# Patient Record
Sex: Female | Born: 1980 | Race: White | Hispanic: No | Marital: Married | Smoking: Never smoker
Health system: Southern US, Community
[De-identification: ages and names within clinical notes are randomized; demographics above are authoritative.]

## PROBLEM LIST (undated history)

## (undated) VITALS — BP 121/83 | HR 81 | Temp 97.7°F | Resp 20 | Ht 66.0 in | Wt 218.0 lb

## (undated) DIAGNOSIS — Z789 Other specified health status: Secondary | ICD-10-CM

---
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
## 2002-03-15 ENCOUNTER — Other Ambulatory Visit: Admission: RE | Admit: 2002-03-15 | Discharge: 2002-03-15 | Payer: Self-pay | Admitting: Obstetrics and Gynecology

## 2002-03-18 ENCOUNTER — Encounter (INDEPENDENT_AMBULATORY_CARE_PROVIDER_SITE_OTHER): Payer: Self-pay | Admitting: *Deleted

## 2002-03-18 ENCOUNTER — Ambulatory Visit (HOSPITAL_COMMUNITY): Admission: RE | Admit: 2002-03-18 | Discharge: 2002-03-18 | Payer: Self-pay | Admitting: Obstetrics and Gynecology

## 2003-07-28 ENCOUNTER — Other Ambulatory Visit: Admission: RE | Admit: 2003-07-28 | Discharge: 2003-07-28 | Payer: Self-pay | Admitting: Obstetrics and Gynecology

## 2004-07-05 ENCOUNTER — Emergency Department (HOSPITAL_COMMUNITY): Admission: EM | Admit: 2004-07-05 | Discharge: 2004-07-05 | Payer: Self-pay | Admitting: Emergency Medicine

## 2006-06-12 ENCOUNTER — Emergency Department (HOSPITAL_COMMUNITY): Admission: EM | Admit: 2006-06-12 | Discharge: 2006-06-12 | Payer: Self-pay | Admitting: Emergency Medicine

## 2007-12-31 ENCOUNTER — Inpatient Hospital Stay (HOSPITAL_COMMUNITY): Admission: AD | Admit: 2007-12-31 | Discharge: 2007-12-31 | Payer: Self-pay | Admitting: Obstetrics and Gynecology

## 2008-03-16 ENCOUNTER — Inpatient Hospital Stay (HOSPITAL_COMMUNITY): Admission: AD | Admit: 2008-03-16 | Discharge: 2008-03-19 | Payer: Self-pay | Admitting: Obstetrics and Gynecology

## 2008-12-29 IMAGING — US US RENAL
1 series · 14 of 25 positions shown · non-contrast
Comparison: None

CLINICAL DATA: Severe back pain.  27 weeks pregnant.

RENAL/URINARY TRACT ULTRASOUND
TECHNIQUE: Complete ultrasound examination of the urinary tract
was performed including evaluation of the kidneys, renal collecting
systems, and urinary bladder.

[Series 1: us renal · 0.28mm/px · 14 of 42 slices shown]
[im 1/42]
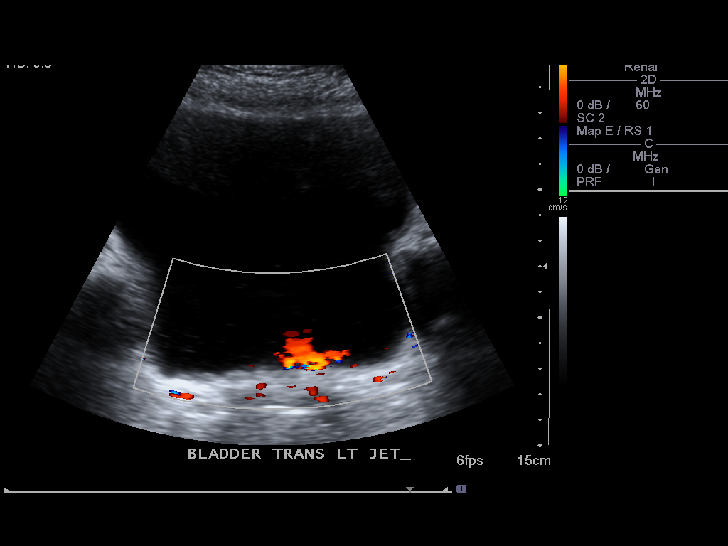
[im 4/42]
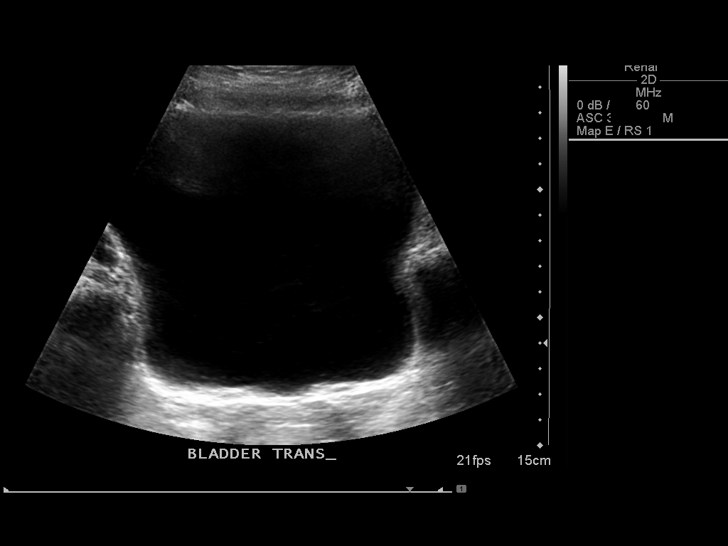
[im 7/42]
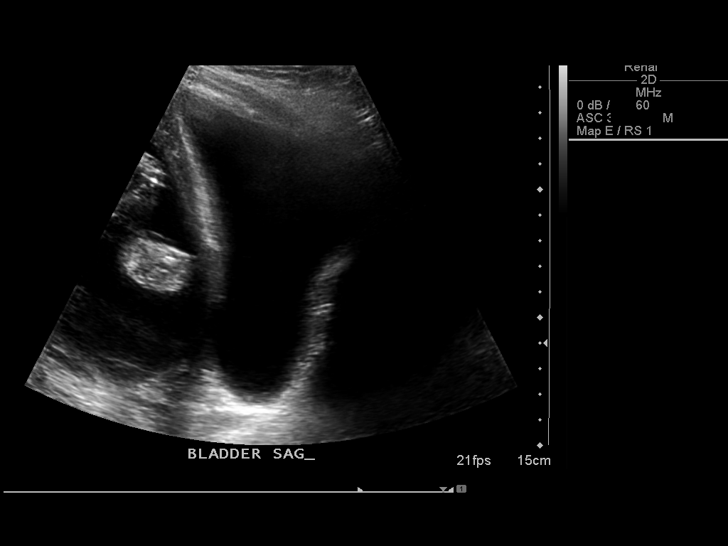
[im 11/42]
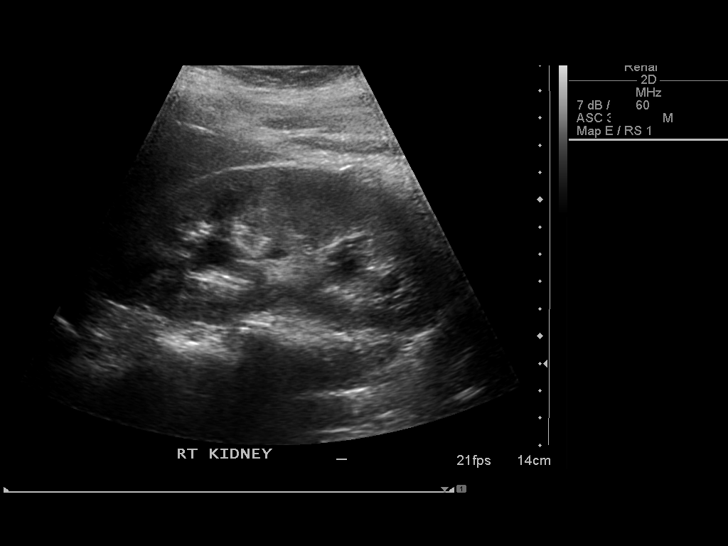
[im 14/42]
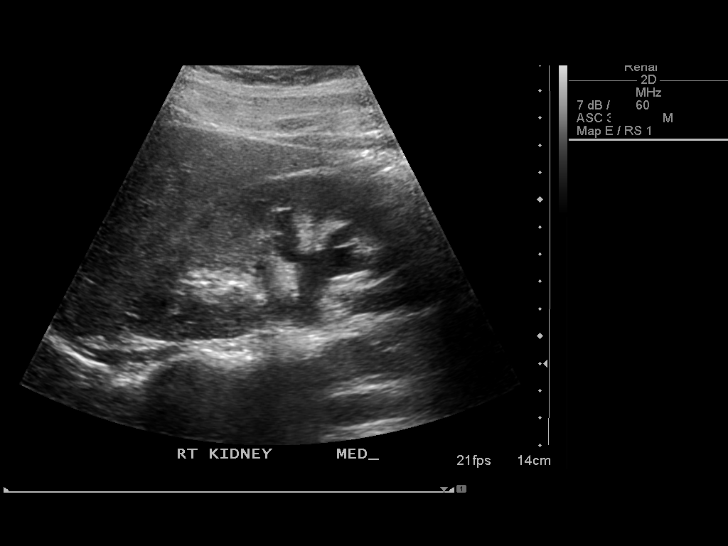
[im 16/42]
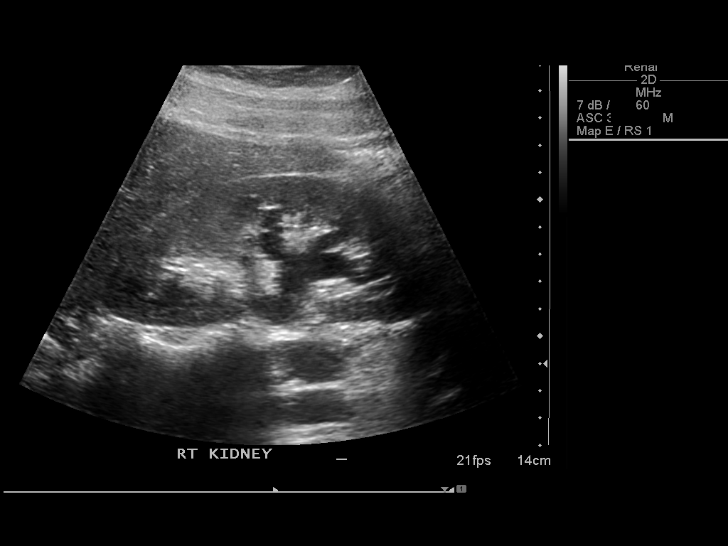
[im 19/42]
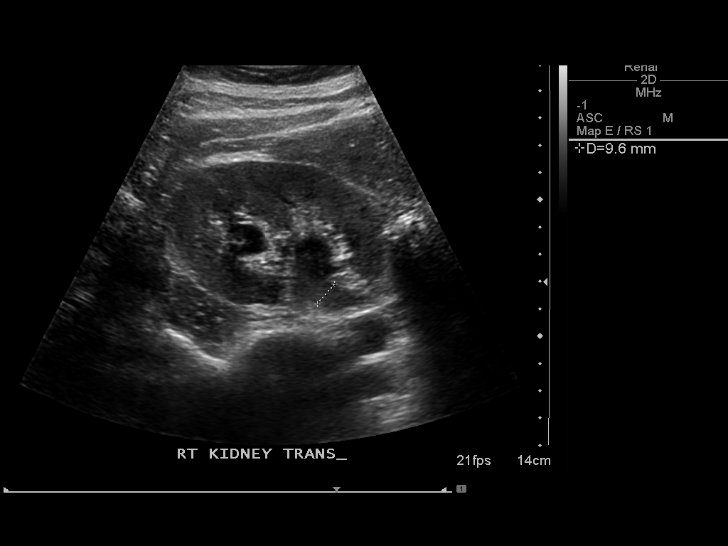
[im 23/42]
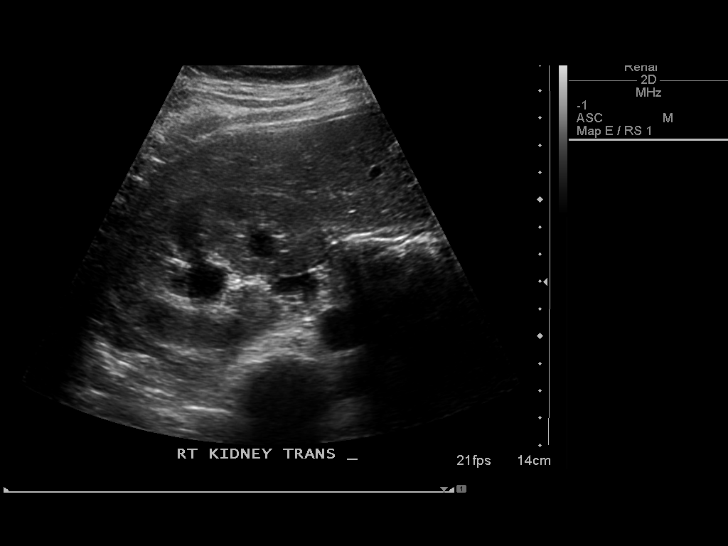
[im 26/42]
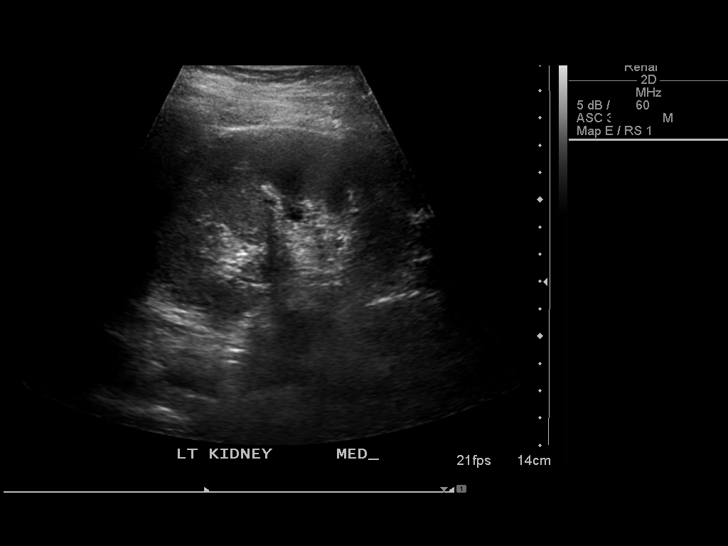
[im 28/42]
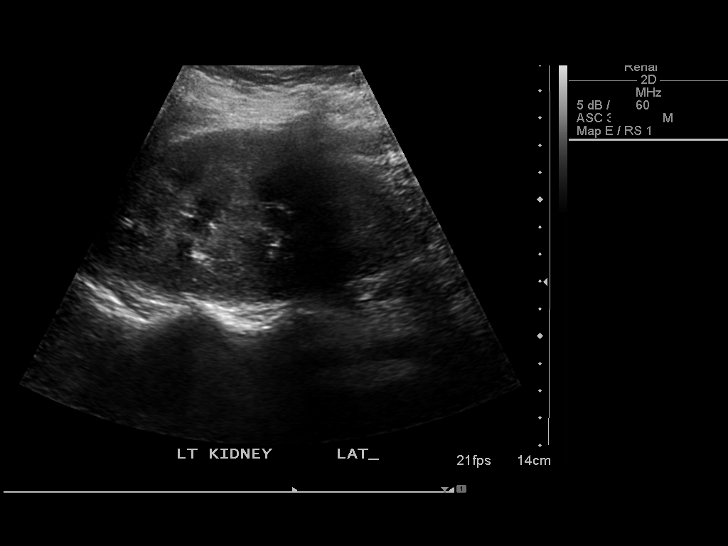
[im 31/42]
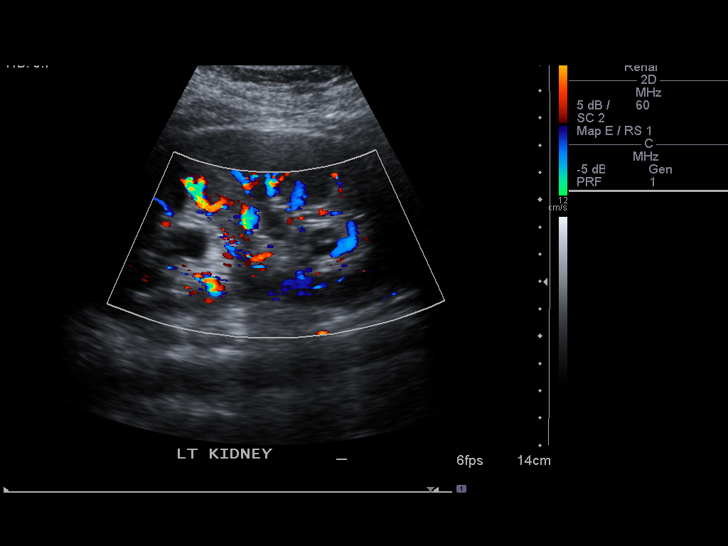
[im 35/42]
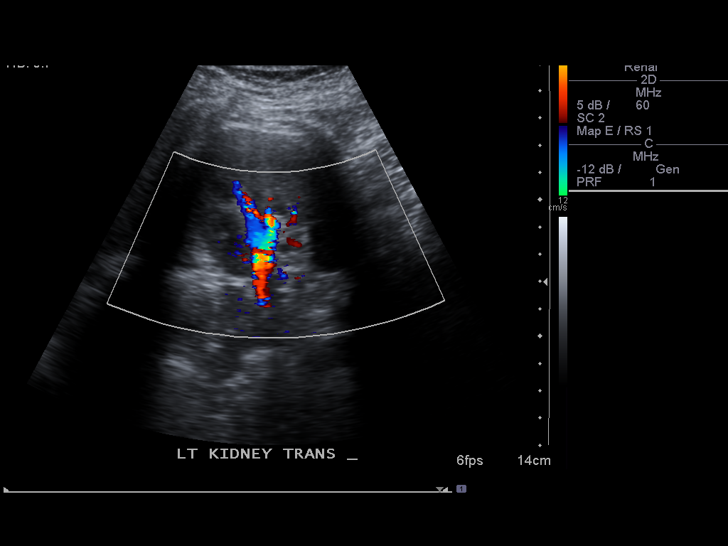
[im 38/42]
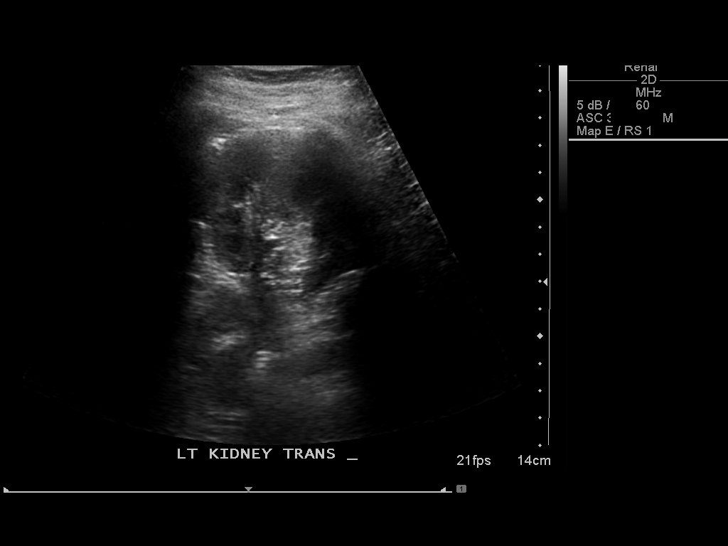
[im 42/42]
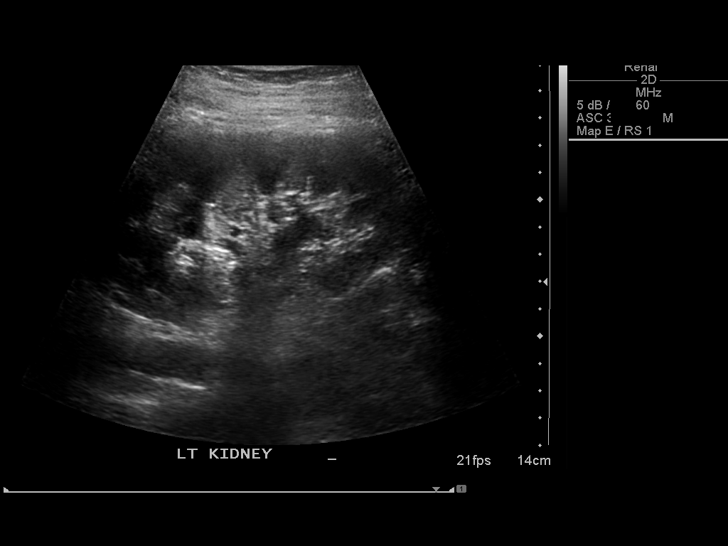

[14 of 25 positions shown; findings below may reference images not displayed]

FINDINGS: Both kidneys are normal in size, with the right kidney
measuring 12.4 cm in the left kidney and 12.3 cm length.  Both
kidneys are normal in parenchymal echogenicity.  No renal masses
are identified.

Mild renal pelvicaliectasis is seen bilaterally, right side
slightly greater than left.  The entire course of the ureters
cannot be visualized by sonography.  Images of the urinary bladder
are unremarkable appearance, and bilateral ureteral jets are
visualized on color Doppler ultrasound.
IMPRESSION: 1.  Mild bilateral renal pelvicaliectasis, right side slightly
greater than left.
2.  No evidence of renal mass or abscess.

## 2010-06-28 LAB — CBC
HCT: 25.4 % — ABNORMAL LOW (ref 36.0–46.0)
HCT: 28.9 % — ABNORMAL LOW (ref 36.0–46.0)
Hemoglobin: 8.7 g/dL — ABNORMAL LOW (ref 12.0–15.0)
Hemoglobin: 9.7 g/dL — ABNORMAL LOW (ref 12.0–15.0)
MCHC: 33.6 g/dL (ref 30.0–36.0)

## 2010-07-27 NOTE — H&P (Signed)
NAMEMATTALYN, ANDEREGG             ACCOUNT NO.:  [id number]   MEDICAL RECORD NO.:  [id number]          PATIENT TYPE:  INP   LOCATION:  9175                          FACILITY:  [facility]   PHYSICIAN:  Lenoard Aden, M.D.DATE OF BIRTH:  1980-12-04   DATE OF ADMISSION:  03/16/2008  DATE OF DISCHARGE:                              HISTORY & PHYSICAL   INDICATIONS FOR ADMISSION:  Prodromal labor with fetal tachycardia.   She is a 30 year old white female G1, P0 at 22 weeks' gestation who  presents with increased frequency of contractions.   She states she has known drug allergies.   MEDICATIONS:  Prenatal vitamins.   She is a nonsmoker, nondrinker.  She denies domestic or physical  violence.   She has a family history of bladder cancer, migraine headache.  She has  1 previous spontaneous-induced abortion.   She has a pregnancy course that is complicated by size and date  discrepancy and UTI, treated with antibiotics.   PHYSICAL EXAMINATION:  GENERAL:  She is an uncomfortable-appearing white  female in no acute distress.  HEENT:  Normal.  LUNGS:  Clear.  HEART:  Regular rhythm.  ABDOMEN:  Soft, gravid, nontender.  Cervix 1, 70%, vertex -1.  EXTREMITIES:  No cords.  NEUROLOGIC:  Nonfocal.  SKIN:  Intact.   NST reveals a reactive fetal heart tracing in the 160-170 beat per  minute range.  No decelerations noted.  Contractions every 3-5 minutes.   IMPRESSION:  Prodromal labor pattern with fetal tachycardia.   PLAN:  Extend monitoring.  We will treat as needed, admit as needed.  Anticipate attempts at vaginal delivery if admitted, otherwise we will  give Stadol for pain and monitor fetal heart rate closely.      Lenoard Aden, M.D.  Electronically Signed    RJT/MEDQ  D:  03/17/2008  T:  03/17/2008  Job:  [id number]

## 2010-07-30 NOTE — Op Note (Signed)
   NAMEESMAE, Margaret Gutierrez                           ACCOUNT NO.:  [id number]   MEDICAL RECORD NO.:  [id number]                   PATIENT TYPE:  AMB   LOCATION:  SDC                                  [facility]:  [facility]   PHYSICIAN:  Lenoard Aden, M.D.             DATE OF BIRTH:  [birth year]   DATE OF PROCEDURE:  03/18/2002  DATE OF DISCHARGE:                                 OPERATIVE REPORT   PREOPERATIVE DIAGNOSIS:  Nine week intrauterine pregnancy for elective  abortion.   POSTOPERATIVE DIAGNOSIS:  Nine week intrauterine pregnancy for elective  abortion.   PROCEDURE:  Suction dilation and evacuation.   SURGEON:  Lenoard Aden, M.D.   ANESTHESIA:  MAC paracervical by Raul Del, M.D.   ESTIMATED BLOOD LOSS:  Less than 50 cc.   COMPLICATIONS:  None.   SPECIMENS:  Products of conception to pathology.   DISPOSITION:  Patient to recovery in good condition.   DESCRIPTION OF PROCEDURE:  After being apprised of the risks of anesthesia,  infection, bleeding and uterine perforation with injury to abdominal organs  and need for repair, the patient was brought to the operating room where she  was administered IV anesthesia with controlled sedation without difficulty.  Examination under anesthesia reveals a 10 week anteflexed midposition  uterus.  No adnexal masses.  The patient was prepped and draped in the usual  sterile fashion and catheterized until the bladder was empty.  The uterus  sounds to approximately 10 cm after paracervical block with 20 cc of dilute  Xylocaine solution placed.  A single-tooth tenaculum was used to grasp the  anterior lip of the cervix which was then dilated up to a #27 Pratt dilator.  A 9 mm suction curet was placed and suction initiated.  Products of  conception were noted.  Blunt curettage in a four quadrant method reveals  the cavity to be empty.  Repeat suction and curettage confirms.  The  procedure is terminated.  Good hemostasis is intoed.   The patient tolerated  the procedure well and is transferred to the recovery room in good  condition.                                               Lenoard Aden, M.D.    RJT/MEDQ  D:  03/18/2002  T:  03/18/2002  Job:  [id number]

## 2010-12-13 LAB — URINE MICROSCOPIC-ADD ON

## 2010-12-13 LAB — URINALYSIS, ROUTINE W REFLEX MICROSCOPIC
Bilirubin Urine: NEGATIVE
Nitrite: NEGATIVE

## 2010-12-13 LAB — URINE CULTURE

## 2012-01-17 ENCOUNTER — Ambulatory Visit (INDEPENDENT_AMBULATORY_CARE_PROVIDER_SITE_OTHER): Payer: BC Managed Care – PPO | Admitting: Licensed Clinical Social Worker

## 2012-01-17 DIAGNOSIS — F39 Unspecified mood [affective] disorder: Secondary | ICD-10-CM

## 2012-01-31 ENCOUNTER — Ambulatory Visit (INDEPENDENT_AMBULATORY_CARE_PROVIDER_SITE_OTHER): Payer: BC Managed Care – PPO | Admitting: Licensed Clinical Social Worker

## 2012-01-31 DIAGNOSIS — F39 Unspecified mood [affective] disorder: Secondary | ICD-10-CM

## 2012-02-14 ENCOUNTER — Ambulatory Visit (INDEPENDENT_AMBULATORY_CARE_PROVIDER_SITE_OTHER): Payer: BC Managed Care – PPO | Admitting: Licensed Clinical Social Worker

## 2012-02-14 DIAGNOSIS — F39 Unspecified mood [affective] disorder: Secondary | ICD-10-CM

## 2012-02-28 ENCOUNTER — Ambulatory Visit (INDEPENDENT_AMBULATORY_CARE_PROVIDER_SITE_OTHER): Payer: BC Managed Care – PPO | Admitting: Licensed Clinical Social Worker

## 2012-02-28 DIAGNOSIS — F39 Unspecified mood [affective] disorder: Secondary | ICD-10-CM

## 2012-03-20 ENCOUNTER — Ambulatory Visit (INDEPENDENT_AMBULATORY_CARE_PROVIDER_SITE_OTHER): Payer: BC Managed Care – PPO | Admitting: Licensed Clinical Social Worker

## 2012-03-20 DIAGNOSIS — F39 Unspecified mood [affective] disorder: Secondary | ICD-10-CM

## 2012-04-03 ENCOUNTER — Ambulatory Visit (INDEPENDENT_AMBULATORY_CARE_PROVIDER_SITE_OTHER): Payer: BC Managed Care – PPO | Admitting: Licensed Clinical Social Worker

## 2012-04-03 DIAGNOSIS — F39 Unspecified mood [affective] disorder: Secondary | ICD-10-CM

## 2012-04-10 ENCOUNTER — Ambulatory Visit (INDEPENDENT_AMBULATORY_CARE_PROVIDER_SITE_OTHER): Payer: BC Managed Care – PPO | Admitting: Licensed Clinical Social Worker

## 2012-04-10 DIAGNOSIS — F39 Unspecified mood [affective] disorder: Secondary | ICD-10-CM

## 2012-04-24 ENCOUNTER — Ambulatory Visit (INDEPENDENT_AMBULATORY_CARE_PROVIDER_SITE_OTHER): Payer: BC Managed Care – PPO | Admitting: Licensed Clinical Social Worker

## 2012-04-24 DIAGNOSIS — F39 Unspecified mood [affective] disorder: Secondary | ICD-10-CM

## 2012-05-08 ENCOUNTER — Ambulatory Visit (INDEPENDENT_AMBULATORY_CARE_PROVIDER_SITE_OTHER): Payer: BC Managed Care – PPO | Admitting: Licensed Clinical Social Worker

## 2012-05-08 DIAGNOSIS — F39 Unspecified mood [affective] disorder: Secondary | ICD-10-CM

## 2012-05-22 ENCOUNTER — Ambulatory Visit (INDEPENDENT_AMBULATORY_CARE_PROVIDER_SITE_OTHER): Payer: BC Managed Care – PPO | Admitting: Licensed Clinical Social Worker

## 2012-05-22 DIAGNOSIS — F39 Unspecified mood [affective] disorder: Secondary | ICD-10-CM

## 2012-06-05 ENCOUNTER — Ambulatory Visit (INDEPENDENT_AMBULATORY_CARE_PROVIDER_SITE_OTHER): Payer: BC Managed Care – PPO | Admitting: Licensed Clinical Social Worker

## 2012-06-05 DIAGNOSIS — F39 Unspecified mood [affective] disorder: Secondary | ICD-10-CM

## 2012-06-19 ENCOUNTER — Ambulatory Visit: Payer: BC Managed Care – PPO | Admitting: Licensed Clinical Social Worker

## 2012-07-03 ENCOUNTER — Ambulatory Visit (INDEPENDENT_AMBULATORY_CARE_PROVIDER_SITE_OTHER): Payer: BC Managed Care – PPO | Admitting: Licensed Clinical Social Worker

## 2012-07-03 DIAGNOSIS — F39 Unspecified mood [affective] disorder: Secondary | ICD-10-CM

## 2012-07-24 ENCOUNTER — Ambulatory Visit: Payer: BC Managed Care – PPO | Admitting: Licensed Clinical Social Worker

## 2012-12-20 ENCOUNTER — Encounter (HOSPITAL_COMMUNITY): Payer: Self-pay

## 2012-12-20 ENCOUNTER — Ambulatory Visit (INDEPENDENT_AMBULATORY_CARE_PROVIDER_SITE_OTHER): Payer: BC Managed Care – PPO | Admitting: Licensed Clinical Social Worker

## 2012-12-20 ENCOUNTER — Inpatient Hospital Stay (HOSPITAL_COMMUNITY)
Admission: RE | Admit: 2012-12-20 | Discharge: 2012-12-25 | DRG: 885 | Disposition: A | Discharge status: Home / Self Care | Payer: No Typology Code available for payment source | Attending: Psychiatry | Admitting: Psychiatry

## 2012-12-20 DIAGNOSIS — R45851 Suicidal ideations: Secondary | ICD-10-CM

## 2012-12-20 DIAGNOSIS — F322 Major depressive disorder, single episode, severe without psychotic features: Secondary | ICD-10-CM

## 2012-12-20 DIAGNOSIS — F329 Major depressive disorder, single episode, unspecified: Principal | ICD-10-CM | POA: Diagnosis present

## 2012-12-20 DIAGNOSIS — F411 Generalized anxiety disorder: Secondary | ICD-10-CM | POA: Diagnosis present

## 2012-12-20 DIAGNOSIS — F39 Unspecified mood [affective] disorder: Secondary | ICD-10-CM

## 2012-12-20 MED ORDER — MAGNESIUM HYDROXIDE 400 MG/5ML PO SUSP: 30.0000 mL | Freq: Every day | ORAL | Status: DC | PRN

## 2012-12-20 MED ORDER — ACETAMINOPHEN 325 MG PO TABS: 650.0000 mg | ORAL_TABLET | Freq: Four times a day (QID) | ORAL | Status: DC | PRN

## 2012-12-20 MED ORDER — INFLUENZA VAC SPLIT QUAD 0.5 ML IM SUSP
0.5000 mL | INTRAMUSCULAR | Status: AC
  Filled 2012-12-20: qty 0.5

## 2012-12-20 MED ORDER — ALUM & MAG HYDROXIDE-SIMETH 200-200-20 MG/5ML PO SUSP: 30.0000 mL | ORAL | Status: DC | PRN

## 2012-12-20 MED ORDER — HYDROXYZINE HCL 25 MG PO TABS
25.0000 mg | ORAL_TABLET | Freq: Every evening | ORAL | Status: DC | PRN
  Administered 2012-12-25: 25 mg via ORAL
  Filled 2012-12-20: qty 1

## 2012-12-20 NOTE — BH Assessment (Signed)
Assessment Note  Margaret Gutierrez is an 32 y.o. female who presents upon the recommendation of her therapist Berniece Andreas.  This Clinical research associate spoke to [place] with written consent from Margaret Hooley.  [name] reports that she has not seen Margaret Gutierrez since May of this year, but that she had been doing therapy with her since last August as she went through her divorce, which was finalized in September of 2014.  Yesterday she received a call from [place] requesting an appointment and during their session today, Special disclosed that she had withheld some information from her boss, who is like a mother to her, and her boss found out and confronted her about it, stating that she had seriously damaged the relationship.  She states Margaret Gutierrez said that she was feeling suicidal and offhandedly stated she might asphyxiate herself.  [name] was concerned because of Margaret Gutierrez's limited support system.  Before this writer was able to begin her assessment, [name] began requesting to leave.  During the assessment, she stated, "I overreacted, which I always do.  I said I couldn't imagine living without her in my life.  As I've been sitting here I've been thinking about my little girl.  That's my whole life and she needs me.  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 Dr Joelene Millin recently found out that Margaret Gutierrez has not been in therapy for the last several months even though Margaret Gutierrez has led her to believe this was the case.  She also discovered that Margaret Gutierrez has maintained a relationship with her ex-husband.  Demoni states that she had some suspicion that Dr Joelene Millin might know, and she made the appointment with her therapist to talk about her deceipt with a plan to disclose it to her, but her boss took her to lunch today and confronted her before she had the opportunity.  She said that her boss is the type of person who takes lying very seriously and that her lie made her feel like she couldn't trust her anymore  and that they needed to pull back from their relationship.  [name] really values [name] position in her life and was very hurt by it's ending.  She is unsure about how to repair it or if that is possible.  Upon reflection about the situation, she recalled that Dr Joelene Millin said, "She loves me, but has to limit time with me and doesn't want to give up on me."  Her concern about losing this relationship and feelings of guilt overcame her and made her think of suicide.  But she later said, "nothing is worth taking your own life especially a friendship.  I'd rather be alive and still have my daughter."  Margaret Gutierrez has a 55.52 year old daughter, Margaret Gutierrez, that she co-parents with her husband.  Despite their recent divorce, Mishaal reports their relationship is better than ever.    She signed a release to obtain collateral information from her ex husband Margaret Gutierrez at the request of Dr Geryl Rankins.  [name] stated that he and Margaret Gutierrez talk 3 times a day and see one another when they're not working.  He states that he is a support to her, but that other than Dr Joelene Millin, Margaret Gutierrez doesn't have much of a support system.  He reports that he spoke to her earlier today after she had been confronted by Dr Joelene Millin and she stated that she "either needed to go away or she was going to do something."  He reminded her about himself and their daughter and said he would always be there for her.  He said, "she sometimes flies off the handle about things and it's about calming her down more than anything."  He reports that they've been together since highschool-13 years-and she has no history of suicidal thoughts or attempts.  He also reports taht "she sometimes rushes into things and doesn't think them through."    Berniece Andreas corroborates that the patient is sometimes impulsive.  This Clinical research associate consulted with Dr Geryl Rankins at [facility] who was concerned about her initial plan reported to her therapist and then denial at [facility].  He recognized her  impulsivity and was concerned that she may act on it as the dispute continues to play out or she recognizes further consequences of it.  He was not comfortable with her minimal supports and the fact that she lived alone.  He believes that she may need medication evaluation and further crisis stabilization and recommends inpatient treatment and involuntary commitment if the patient is unwilling to sign herself in voluntarily.  Margaret Gutierrez was tearful when notified, stating she just wanted to go home to her daughter, but was willing to sign in voluntariily and hopeful she would be discharged tomorrow.  Berniece Andreas notified of admission.    Axis I: Major Depression, Recurrent severe Axis II: Deferred Axis III: No past medical history on file. Axis IV: problems with primary support group Axis V: 41-50 serious symptoms  Past Medical History: No past medical history on file.  No past surgical history on file.  Family History: No family history on file.  Social History:  has no tobacco, alcohol, and drug history on file.  Additional Social History:  Alcohol / Drug Use History of alcohol / drug use?: No history of alcohol / drug abuse  CIWA:   COWS:    Allergies: Allergies not on file  Home Medications:  No prescriptions prior to admission    OB/GYN Status:  No LMP recorded.  General Assessment Data Location of Assessment: [facility] Is this a Tele or Face-to-Face Assessment?: Face-to-Face Is this an Initial Assessment or a Re-assessment for this encounter?: Initial Assessment Living Arrangements: Children Can pt return to current living arrangement?: Yes Admission Status: Voluntary Transfer from: Acute Hospital Referral Source: Self/Family/Friend     [facility] Crisis Care Plan Living Arrangements: Children Name of Therapist: Berniece Andreas  Education Status Is patient currently in school?: No  Risk to self Suicidal Ideation: Yes-Currently Present Suicidal Intent: No Is  patient at risk for suicide?: Yes Suicidal Plan?: No-Not Currently/Within Last 6 Months (reportedly asphixiation) Access to Means: No What has been your use of drugs/alcohol within the last 12 months?: denies Previous Attempts/Gestures: No How many times?: 0 Other Self Harm Risks: denies Intentional Self Injurious Behavior: None Family Suicide History: No Recent stressful life event(s): Turmoil (Comment) (damaged relationship with a friend) Persecutory voices/beliefs?: No Depression: Yes Depression Symptoms: Despondent;Tearfulness;Guilt Substance abuse history and/or treatment for substance abuse?: No Suicide prevention information given to non-admitted patients: Yes  Risk to Others Homicidal Ideation: No Thoughts of Harm to Others: No Current Homicidal Intent: No Current Homicidal Plan: No Access to Homicidal Means: No History of harm to others?: No Assessment of Violence: On admission Does patient have access to weapons?: No Criminal Charges Pending?: No Does patient have a court date: No  Psychosis Hallucinations: None noted Delusions: None noted  Mental Status Report Appear/Hygiene: Other (Comment) (unremarkable) Eye Contact: Good Motor Activity: Freedom of  movement Speech: Logical/coherent Level of Consciousness: Alert Mood: Depressed Affect: Appropriate to circumstance Anxiety Level: None Thought Processes: Relevant;Coherent Judgement: Impaired Orientation: Person;Place;Time;Situation Obsessive Compulsive Thoughts/Behaviors: None  Cognitive Functioning Concentration: Normal Memory: Recent Intact;Remote Intact IQ: Average Insight: Fair Impulse Control: Fair Appetite: Good Weight Loss:  (unk) Weight Gain: 0 Sleep: Decreased Total Hours of Sleep: 4 Vegetative Symptoms: None  ADLScreening [facility] Assessment Services) Patient's cognitive ability adequate to safely complete daily activities?: Yes Patient able to express need for assistance with ADLs?:  Yes Independently performs ADLs?: Yes (appropriate for developmental age)  Prior Inpatient Therapy Prior Inpatient Therapy: No  Prior Outpatient Therapy Prior Outpatient Therapy: Yes Prior Therapy Dates: August 2013-may 2014 and Recently Prior Therapy Facilty/Provider(s): [name] [place] Reason for Treatment: Divorce  ADL Screening (condition at time of admission) Patient's cognitive ability adequate to safely complete daily activities?: Yes Patient able to express need for assistance with ADLs?: Yes Independently performs ADLs?: Yes (appropriate for developmental age)       Abuse/Neglect Assessment (Assessment to be complete while patient is alone) Physical Abuse: Denies Verbal Abuse: Denies Sexual Abuse: Denies Exploitation of patient/patient's resources: Denies     Merchant navy officer (For Healthcare) Advance Directive: Patient does not have advance directive;Patient would not like information Pre-existing out of facility DNR order (yellow form or pink MOST form): No Nutrition Screen- [facility] Adult/WL/AP Patient's home diet: Regular  Additional Information 1:1 In Past 12 Months?: No CIRT Risk: No Elopement Risk: No Does patient have medical clearance?: Yes     Disposition:  Disposition Initial Assessment Completed for this Encounter: Yes Disposition of Patient: Inpatient treatment program Type of inpatient treatment program: Adult  On Site Evaluation by:   Reviewed with Physician:    Steward Ros 12/20/2012 7:38 PM

## 2012-12-20 NOTE — Progress Notes (Signed)
Patient ID: Margaret Gutierrez, female   DOB: 11/15/1980, 32 y.o.   MRN: [id number]  Admission Note:  D:32 yr female who presents [facility] in no acute distress for the treatment of SI and Depression. Pt appears flat and depressed. Pt was calm and cooperative with admission process. Pt denies SI/HI/AVH/Pain at this time. Pt states she went to her therapist and told her that she was thinking about killing herself, by Carbon Monoxide poisoning. Pt states "I'm ok now". " I was upset because I lied to a close friend and she found out, and we had a falling out  and told me how hurt she was and I felt bad"   A:Skin was assessed(by female nurse)  and found to be clear of any abnormal marks apart from tattoo L-forearm 2 tattoo on upper back. POC and unit policies explained and understanding verbalized. Consents obtained. Food and fluids offered, and  Accepted.   Pt had no additional questions or concerns.

## 2012-12-20 NOTE — Tx Team (Signed)
Initial Interdisciplinary Treatment Plan  PATIENT STRENGTHS: (choose at least two) Ability for insight General fund of knowledge Motivation for treatment/growth Physical Health  PATIENT STRESSORS: Friend problems   PROBLEM LIST: Problem List/Patient Goals Date to be addressed Date deferred Reason deferred Estimated date of resolution  Risk doe SI      Depression                                                 DISCHARGE CRITERIA:  Improved stabilization in mood, thinking, and/or behavior  PRELIMINARY DISCHARGE PLAN: Outpatient therapy  PATIENT/FAMIILY INVOLVEMENT: This treatment plan has been presented to and reviewed with the patient, Margaret Gutierrez.  The patient and family have been given the opportunity to ask questions and make suggestions.  Jacques Navy A 12/20/2012, 8:41 PM

## 2012-12-21 DIAGNOSIS — F411 Generalized anxiety disorder: Secondary | ICD-10-CM

## 2012-12-21 DIAGNOSIS — R45851 Suicidal ideations: Secondary | ICD-10-CM

## 2012-12-21 DIAGNOSIS — F322 Major depressive disorder, single episode, severe without psychotic features: Secondary | ICD-10-CM | POA: Diagnosis present

## 2012-12-21 DIAGNOSIS — F329 Major depressive disorder, single episode, unspecified: Principal | ICD-10-CM

## 2012-12-21 LAB — URINALYSIS, DIPSTICK ONLY
Glucose, UA: NEGATIVE mg/dL
Ketones, ur: NEGATIVE mg/dL
Urobilinogen, UA: 0.2 mg/dL (ref 0.0–1.0)
pH: 6 (ref 5.0–8.0)

## 2012-12-21 NOTE — Tx Team (Signed)
Interdisciplinary Treatment Plan Update   Date Reviewed:  12/21/2012  Time Reviewed:  10:32 AM  Progress in Treatment:   Attending groups: Yes Participating in groups: Yes Taking medication as prescribed: Yes  Tolerating medication: Yes Family/Significant other contact made: No, but will ask patient for consent for collateral contact Patient understands diagnosis: Yes  Discussing patient identified problems/goals with staff: Yes Medical problems stabilized or resolved: Yes Denies suicidal/homicidal ideation: Yes Patient has not harmed self or others: Yes  For review of initial/current patient goals, please see plan of care.  Estimated Length of Stay:  3-5 days  Reasons for Continued Hospitalization:  Anxiety Depression Medication stabilization   New Problems/Goals identified:    Discharge Plan or Barriers:   Home with outpatient follow up  Additional Comments:  Margaret Gutierrez is an 32 y.o. female who presents upon the recommendation of her therapist Berniece Andreas. This Clinical research associate spoke to [place] with written consent from [name]. [name] reports that she has not seen [name] since May of this year, but that she had been doing therapy with her since last August as she went through her divorce, which was finalized in September of 2014. Yesterday she received a call from [place] requesting an appointment and during their session today, Nettye disclosed that she had withheld some information from her boss, who is like a mother to her, and her boss found out and confronted her about it, stating that she had seriously damaged the relationship. She states Shakti said that she was feeling suicidal and offhandedly stated she might asphyxiate herself. [name] was concerned because of [name]'s limited support    Attendees:  Patient:  12/21/2012 10:32 AM   Signature: Mervyn Gay, MD 12/21/2012 10:32 AM  Signature:  Verne Spurr, PA 12/21/2012 10:32 AM  Signature: Harold Barban, RN 12/21/2012  10:32 AM  Signature:Beverly Terrilee Croak, RN 12/21/2012 10:32 AM  Signature:  Neill Loft RN 12/21/2012 10:32 AM  Signature:  Juline Patch, LCSW 12/21/2012 10:32 AM  Signature:  Reyes Ivan, LCSW 12/21/2012 10:32 AM  Signature:  Sharin Grave Coordinator 12/21/2012 10:32 AM  Signature:  Chinita Greenland, RN 12/21/2012 10:32 AM  Signature: Leighton Parody, RN 12/21/2012  10:32 AM  Signature:   Nestor Ramp, RN 12/21/2012  10:32 AM  Signature:  Elliot Cousin, RN 12/21/2012  10:32 AM    Scribe for Treatment Team:   Juline Patch,  12/21/2012 10:32 AM

## 2012-12-21 NOTE — [facility] Group Notes (Signed)
[facility] LCSW Aftercare Discharge Planning Group Note   12/21/2012 10:51 AM    Participation Quality:  [place]  Mood/Affect:  Appropriate  Depression Rating:  1  Anxiety Rating:  1  Thoughts of Suicide:  No  Will you contract for safety?   NA  Current AVH:  No  Plan for Discharge/Comments:  Patient attending discharge planning group and actively participated in group. Patient advised she is sees Berniece Andreas for outpatient services.  SW provided all participants with daily workbook and information on services offered by [facility].   Transportation Means: Patient has transportation.   Supports:  Patient has a support system.   Collette Pescador, Joesph July

## 2012-12-21 NOTE — Progress Notes (Signed)
D: Patient appropriate and cooperative with staff and peers. Patient's affect is sad and mood is depressed. She reported on the self inventory sheet that she slept well last night, appetite and ability to pay attention are both good and energy level is normal. Patient rated depression and feelings of hopelessness "1". She's attending groups and meals. No scheduled medications at this time.  A: Support and encouragement provided to patient. Monitor Q15 minute checks for safety.  R: Patient receptive. Denies SI/HI/AVH. Patient remains safe on the unit.
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
## 2012-12-21 NOTE — Progress Notes (Signed)
Patient has been up and active on the unit, attended group this evening and has voiced no complaints. Patient reports having had a good day and her ex-husband visited her on today. Patient reports that she misses her daughter.  Patient currently denies having pain, -si/hi/a/v hall. Support and encouragement offered, safety maintained on unit, will continue to monitor.

## 2012-12-21 NOTE — [facility] Group Notes (Signed)
[facility] LCSW Group Therapy  Feelings Around Relapse 1:15 -2:30        12/21/2012  3:41 PM   Type of Therapy:  Group Therapy  Participation Level:  Appropriate  Participation Quality:  Appropriate  Affect:  Appropriate  Cognitive:  Attentive Appropriate  Insight:  Developing/Improving  Engagement in Therapy: Developing/Improving  Modes of Intervention:  Discussion Exploration Problem-Solving Supportive  Summary of Progress/Problems:  The topic for today was feelings around relapse.    Patient processed feelings toward relapse and was able to relate to peers. She advised relapse for her would be being dishonest out of fear of not being accepted.  Patient identified coping skills that can be used to prevent a relapse.   Wynn Banker 12/21/2012 3:41 PM
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
## 2012-12-21 NOTE — [facility] Suicide Risk Assessment (Signed)
Suicide Risk Assessment  Admission Assessment     Nursing information obtained from:  Patient Demographic factors:  Living alone Current Mental Status:  Self-harm thoughts Loss Factors:  NA Historical Factors:  NA Risk Reduction Factors:  Responsible for children under 30 years of age;Living with another person, especially a relative;Employed  CLINICAL FACTORS:   Severe Anxiety and/or Agitation Depression:   Anhedonia Hopelessness Impulsivity Insomnia Recent sense of peace/wellbeing Severe Unstable or Poor Therapeutic Relationship Previous Psychiatric Diagnoses and Treatments  COGNITIVE FEATURES THAT CONTRIBUTE TO RISK:  Closed-mindedness Loss of executive function Polarized thinking Thought constriction (tunnel vision)    SUICIDE RISK:   Moderate:  Frequent suicidal ideation with limited intensity, and duration, some specificity in terms of plans, no associated intent, good self-control, limited dysphoria/symptomatology, some risk factors present, and identifiable protective factors, including available and accessible social support.  PLAN OF CARE: Admit voluntarily and emergently from [facility] assessment for depression and suicidal ideation with plan of carbon monoxide poisoning.   I certify that inpatient services furnished can reasonably be expected to improve the patient's condition.   [name]., MD 12/21/2012, 9:21 AM

## 2012-12-22 LAB — COMPREHENSIVE METABOLIC PANEL
ALT: 8 U/L (ref 0–35)
AST: 12 U/L (ref 0–37)
Albumin: 3.5 g/dL (ref 3.5–5.2)
Alkaline Phosphatase: 99 U/L (ref 39–117)
BUN: 8 mg/dL (ref 6–23)
CO2: 24 mEq/L (ref 19–32)
Calcium: 8.8 mg/dL (ref 8.4–10.5)
Chloride: 101 mEq/L (ref 96–112)
Creatinine, Ser: 0.79 mg/dL (ref 0.50–1.10)
GFR calc Af Amer: >90 mL/min (ref >90)
GFR calc non Af Amer: >90 mL/min (ref >90)
Glucose, Bld: 113 mg/dL — ABNORMAL HIGH (ref 70–99)
Potassium: 3.6 mEq/L (ref 3.5–5.1)
Sodium: 135 mEq/L (ref 135–145)
Total Bilirubin: 0.7 mg/dL (ref 0.3–1.2)
Total Protein: 7.2 g/dL (ref 6.0–8.3)

## 2012-12-22 LAB — CBC WITH DIFFERENTIAL/PLATELET
Basophils Absolute: 0 10*3/uL (ref 0.0–0.1)
Basophils Relative: 0 % (ref 0–1)
Eosinophils Absolute: 0.2 10*3/uL (ref 0.0–0.7)
Eosinophils Relative: 2 % (ref 0–5)
HCT: 39.4 % (ref 36.0–46.0)
Hemoglobin: 13.3 g/dL (ref 12.0–15.0)
Lymphocytes Relative: 17 % (ref 12–46)
Lymphs Abs: 1.2 10*3/uL (ref 0.7–4.0)
MCH: 27.8 pg (ref 26.0–34.0)
MCHC: 33.8 g/dL (ref 30.0–36.0)
MCV: 82.4 fL (ref 78.0–100.0)
Monocytes Absolute: 0.5 10*3/uL (ref 0.1–1.0)
Monocytes Relative: 7 % (ref 3–12)
Neutro Abs: 5.2 10*3/uL (ref 1.7–7.7)
Neutrophils Relative %: 74 % (ref 43–77)
Platelets: 302 10*3/uL (ref 150–400)
RBC: 4.78 MIL/uL (ref 3.87–5.11)
RDW: 13.1 % (ref 11.5–15.5)
WBC: 7.1 10*3/uL (ref 4.0–10.5)

## 2012-12-22 LAB — TSH: TSH: 6.868 u[IU]/mL — ABNORMAL HIGH (ref 0.350–4.500)

## 2012-12-22 NOTE — Progress Notes (Signed)
[facility] Group Notes:  (Nursing/MHT/Case Management/Adjunct)  Date:  12/21/2012 Time:  2000  Type of Therapy:  Psychoeducational Skills  Participation Level:  Active  Participation Quality:  Appropriate  Affect:  Appropriate  Cognitive:  Appropriate  Insight:  Good  Engagement in Group:  Engaged  Modes of Intervention:  Education  Summary of Progress/Problems: The patient described her day as having been good. She stated that she had placed a few telephone calls and that she felt more relaxed today. Her goal for tomorrow is to "work on me".   Tevin Shillingford S 12/22/2012, 12:41 AM

## 2012-12-22 NOTE — Progress Notes (Signed)
Patient ID: Margaret Gutierrez, female   DOB: September 25, 1980, 32 y.o.   MRN: [id number] D: Pt is awake and active on the unit this AM. Pt denies SI/HI and A/V hallucinations. Pt rates their depression at 1 and hopelessness at 1. Pt's mood is depressed/withdrawn and her affect is flat. Pt writes that she plans to "live an honest and truthful life and do the best I can. I'm feeling much better about things. I would like to go home and see my little girl." Pt forwards little to staff but is attending groups and participating gin the milieu.   A: Encouraged pt to discuss feelings with staff and administered medication per MD orders. Writer also encouraged pt to participate in groups.  R: Pt is attending groups and tolerating medications well. Writer will continue to monitor. 15 minute checks are ongoing for safety.

## 2012-12-22 NOTE — Progress Notes (Signed)
Psychoeducational Group Note  Date:  10/22/2011 Time: 1015  Group Topic/Focus:  Identifying Needs:   The focus of this group is to help patients identify their personal needs that have been historically problematic and identify healthy behaviors to address their needs.  Participation Level:  active Participation Quality: good Affect: flat Cognitive:    Insight:  good  Engagement in Group: engaged  Additional Comments:  PD RN bC

## 2012-12-22 NOTE — Progress Notes (Addendum)
Patient ID: Margaret Gutierrez, female   DOB: 05-12-1980, 32 y.o.   MRN: [id number]  S:  Seen today. attending groups. Better sleep and mood is improving now.  Psychiatric Specialty Exam:  Physical Exam   ROS negative    General Appearance: Guarded   Eye Contact:: Minimal   Speech: Normal Rate   Volume: Normal   Mood: Anxious, Depressed, Hopeless and Worthless   Affect: Constricted and Depressed   Thought Process: Goal Directed and Intact   Orientation: Full (Time, Place, and Person)   Thought Content: WDL   Suicidal Thoughts: denies   Homicidal Thoughts: No   Memory: Immediate; Fair   Judgement: Impaired   Insight: Lacking   Psychomotor Activity: normal  Concentration: Fair   Recall: Fair   Akathisia: NA   Handed: Right   AIMS (if indicated):   Assets: Communication Skills  Desire for Improvement  Financial Resources/Insurance  Housing  Physical Health  Resilience  Social Support  Transportation   Sleep: Number of Hours: 6.75   .   Assessment:  DSM5:  Schizophrenia Disorders:  Obsessive-Compulsive Disorders:  Trauma-Stressor Disorders:  Substance/Addictive Disorders:  Depressive Disorders:  AXIS I: Generalized Anxiety Disorder and Major Depression, single episode  AXIS II: Cluster B Traits  AXIS III: History reviewed. No pertinent past medical history.  AXIS IV: other psychosocial or environmental problems, problems related to social environment and problems with primary support group  AXIS V: 35  Treatment Plan/Recommendations:  Continue current meds

## 2012-12-22 NOTE — Progress Notes (Signed)
D: Pt has participated appropriately in all unit activities. Pt states that she is feeling better.Denies SI,HI, & AVH. Pt reports that she is sleeping well. A: Supported & encouraged. Continues on 15 minute checks. R: Pt safety maintained.

## 2012-12-22 NOTE — [facility] Group Notes (Signed)
[facility] Group Notes: (Clinical Social Work)   12/22/2012      Type of Therapy:  Group Therapy   Participation Level:  Did Not Attend    Ambrose Mantle, LCSW 12/22/2012, 4:16 PM

## 2012-12-23 LAB — RAPID URINE DRUG SCREEN, HOSP PERFORMED
Amphetamines: NOT DETECTED
Benzodiazepines: NOT DETECTED
Opiates: NOT DETECTED

## 2012-12-23 NOTE — Progress Notes (Signed)
[facility] Group Notes:  (Nursing/MHT/Case Management/Adjunct)  Date:  12/22/2012 Time:  2000  Type of Therapy:  Psychoeducational Skills  Participation Level:  Minimal  Participation Quality:  Resistant  Affect:  Appropriate  Cognitive:  Appropriate  Insight:  Lacking  Engagement in Group:  Lacking  Modes of Intervention:  Education  Summary of Progress/Problems: The patient shared with the group that she had a good day but would not offer any additional details. Her coping mechanisms include the following: exercise, and group therapy. Her goal for tomorrow is to make a phone call for which she has been preparing herself for some time.   Kaenan Jake S 12/23/2012, 2:08 AM

## 2012-12-23 NOTE — Progress Notes (Signed)
Report received from C. Microbiologist. Writer entered patients room and observed her lying in bed asleep with eyes closed and resp. even and unlabored

## 2012-12-23 NOTE — Progress Notes (Signed)
Patient ID: Margaret Gutierrez, female   DOB: [birth year], 32 y.o.   MRN: [id number]  S:  Seen today. attending groups. Better sleep and mood is fair. Not interested for any meds now.  Psychiatric Specialty Exam:  Physical Exam   ROS negative    General Appearance: Guarded   Eye Contact:: Minimal   Speech: Normal Rate   Volume: Normal   Mood: Anxious, Depressed, Hopeless and Worthless   Affect: Constricted and Depressed   Thought Process: Goal Directed and Intact   Orientation: Full (Time, Place, and Person)   Thought Content: WDL   Suicidal Thoughts: denies   Homicidal Thoughts: No   Memory: Immediate; Fair   Judgement: Impaired   Insight: Lacking   Psychomotor Activity: normal  Concentration: Fair   Recall: Fair   Akathisia: NA   Handed: Right   AIMS (if indicated):   Assets: Communication Skills  Desire for Improvement  Financial Resources/Insurance  Housing  Physical Health  Resilience  Social Support  Transportation   Sleep: Number of Hours: 6.75   .   Assessment:  DSM5:  Schizophrenia Disorders:  Obsessive-Compulsive Disorders:  Trauma-Stressor Disorders:  Substance/Addictive Disorders:  Depressive Disorders:  AXIS I: Generalized Anxiety Disorder and Major Depression, single episode  AXIS II: Cluster B Traits  AXIS III: History reviewed. No pertinent past medical history.  AXIS IV: other psychosocial or environmental problems, problems related to social environment and problems with primary support group  AXIS V: 45  Treatment Plan/Recommendations:  Continue current meds

## 2012-12-23 NOTE — Progress Notes (Signed)
Date: 12/23/2012  Time: 1015  Group Topic/Focus:  Making Healthy Choices: The focus of this group is to help patients identify negative/unhealthy choices they were using prior to admission and identify positive/healthier coping strategies to replace them upon discharge.  Participation Level: Active  Participation Quality: Appropriate  Affect: Appropriate  Cognitive: Oriented  Insight: Improving  Engagement in Group: Improving  Additional Comments: was engaged in the discussion and partisipated  Kessler Kopinski A  12/23/2012  

## 2012-12-23 NOTE — Progress Notes (Signed)
Patient has been up and active on the unit, attended group this evening and has voiced no complaints. Patient currently denies having pain, -si/hi/a/v hall. Support and encouragement offered, safety maintained on unit, will continue to monitor.  

## 2012-12-23 NOTE — Progress Notes (Addendum)
Psychoeducational Group Note  Date: 12/23/2012 Time: 0930 Group Topic/Focus:  Gratefulness:  The focus of this group is to help patients identify what three things they are most grateful for in their lives. What helps ground them and to center them on their work to their recovery.  Participation Level:  Active  Participation Quality:  Appropriate  Affect:  Appropriate  Cognitive:  Alert  Insight:  Improving  Engagement in Group:  Engaged  Additional Comments:  Pt stated she was grateful forher daughter, surrogate mother and her animals  Dione Housekeeper

## 2012-12-23 NOTE — [facility] Group Notes (Signed)
[facility] Group Notes:  (Clinical Social Work)  12/23/2012   3:00-4:00PM  Summary of Progress/Problems:   The main focus of today's process group was to   identify the patient's current support system and decide on other supports that can be put in place.  The picture on workbook was used to discuss why additional supports are needed, and a hand-out was distributed with four definitions/levels of support, then used to talk about how patients have given and received all different kinds of support.  An emphasis was placed on using counselor, doctor, therapy groups, 12-step groups, and problem-specific support groups to expand supports.  The patient expressed full comprehension of the concepts presented, and agreed that there is a need to add more supports.  She is interested in adding some volunteer activities.  Type of Therapy:  Process Group  Participation Level:  Active  Participation Quality:  Attentive, Sharing and Supportive  Affect:  Blunted  Cognitive:  Oriented  Insight:  Engaged  Engagement in Therapy:  Engaged  Modes of Intervention:  Education,  Support and [employer], LCSW 12/23/2012, 4:17 PM

## 2012-12-23 NOTE — Progress Notes (Signed)
Patient ID: Margaret Gutierrez, female   DOB: October 14, 1980, 32 y.o.   MRN: [id number] D: Pt is awake and active on the unit this AM. Pt denies SI/HI and A/V hallucinations. Pt mood is depressed and her affect is sad/blunted. Pt is preoccupied with discharge stating that she needs to get back to work as well as see her daughter. She continues to attend groups but is withdrawn and forwards very little to staff.   A: Encouraged pt to discuss feelings with staff and administered medication per MD orders. Writer also encouraged pt to participate in groups.  R: Pt is attending groups and tolerating medications well. Writer will continue to monitor. 15 minute checks are ongoing for safety.

## 2012-12-24 NOTE — [facility] Group Notes (Signed)
[facility] LCSW Aftercare Discharge Planning Group Note   12/24/2012 8:45 AM  Participation Quality:  Alert and Appropriate   Mood/Affect:  Appropriate and Calm  Depression Rating:  0  Anxiety Rating:  0  Thoughts of Suicide:  Pt denies SI/HI  Will you contract for safety?   Yes  Current AVH:  Pt denies  Plan for Discharge/Comments:  Pt attended discharge planning group and actively participated in group.  CSW provided pt with today's workbook.  Pt reports feeling well today.  Pt reports having outpatient providers with [facility] and no psychiatrist.  CSW will secure pt's follow up appointments.  Pt reports living in [place]. No further needs voiced by pt at this time.    Transportation Means: Pt reports access to transportation - reports having her own car here  Supports: No supports mentioned at this time  Margaret Gutierrez, LCSWA 12/24/2012 10:34 AM

## 2012-12-24 NOTE — Tx Team (Signed)
Interdisciplinary Treatment Plan Update (Adult)  Date: 12/24/2012  Time Reviewed:  9:45 AM  Progress in Treatment: Attending groups: Yes Participating in groups:  Yes Taking medication as prescribed:  Yes Tolerating medication:  Yes Family/Significant othe contact made: CSW assessing  Patient understands diagnosis:  Yes Discussing patient identified problems/goals with staff:  Yes Medical problems stabilized or resolved:  Yes Denies suicidal/homicidal ideation: Yes Issues/concerns per patient self-inventory:  Yes Other:  New problem(s) identified: N/A  Discharge Plan or Barriers: Pt has follow up scheduled at [facility] [place] for therapy and medication management.    Reason for Continuation of Hospitalization: Anxiety Depression Medication Stabilization  Comments: N/A  Estimated length of stay: 1 day, d/c tomorrow  For review of initial/current patient goals, please see plan of care.  Attendees: Patient:     Family:     Physician:  Dr. Javier Glazier 12/24/2012 12:17 PM   Nursing:   Quintella Reichert, RN 12/24/2012 12:17 PM   Clinical Social Worker:  Reyes Ivan, LCSWA 12/24/2012 12:17 PM   Other: Verne Spurr, PA 12/24/2012 12:17 PM   Other:  Frankey Shown, MA care coordination 12/24/2012 12:17 PM   Other:  Neill Loft, RN 12/24/2012 12:17 PM   Other:     Other:    Other:    Other:    Other:    Other:    Other:     Scribe for Treatment Team:   Carmina Miller, 12/24/2012 12:17 PM

## 2012-12-24 NOTE — [facility] Group Notes (Signed)
[facility] LCSW Group Therapy  12/24/2012  1:15 PM   Type of Therapy:  Group Therapy  Participation Level:  Active  Participation Quality:  Appropriate and Attentive  Affect:  Appropriate and Calm  Cognitive:  Alert and Appropriate  Insight:  Developing/Improving and Engaged  Engagement in Therapy:  Developing/Improving and Engaged  Modes of Intervention:  Clarification, Confrontation, Discussion, Education, Exploration, Limit-setting, Orientation, Problem-solving, Rapport Building, Dance movement psychotherapist, Socialization and Support  Summary of Progress/Problems: Pt identified obstacles faced currently and processed barriers involved in overcoming these obstacles. Pt identified steps necessary for overcoming these obstacles and explored motivation (internal and external) for facing these difficulties head on. Pt further identified one area of concern in their lives and chose a goal to focus on for today. Pt shared that her obstacle is fixing the relationship with her mother.  Pt states that she plans to mend her relation by apologizing first but then showing her mother through her actions.  Pt actively participated and was engaged in group discussion.    Margaret Gutierrez, LCSWA 12/24/2012 3:09 PM

## 2012-12-24 NOTE — Progress Notes (Signed)
D:  Patient's self inventory sheet, patient sleeps well, good appetite, normal energy level, good attention span.  Denied depression and hopeless.  Denied withdrawals.  Denied SI.  Denied physical problems.  Denied pain.  After discharge, plans to make things right and focus on daughter.  Would like to be discharged today.  Does have discharge plans.  No problems taking meds after discharge. A:  Medications administered per MD orders.  Emotional support and encouragement given patient. R:  Denied SI and HI.  Denied A/V hallucinations.  Denied pain.  Will continue to monitor patient for safety with 15 minute checks.  Safety maintained.

## 2012-12-24 NOTE — Progress Notes (Signed)
Adult Psychoeducational Group Note  Date:  12/24/2012 Time:  11:00am Group Topic/Focus:  Self Care:   The focus of this group is to help patients understand the importance of self-care in order to improve or restore emotional, physical, spiritual, interpersonal, and financial health.  Participation Level:  Active  Participation Quality:  Appropriate and Attentive  Affect:  Appropriate  Cognitive:  Alert  Insight: Appropriate  Engagement in Group:  Engaged  Modes of Intervention:  Discussion and Education  Additional Comments:  Pt attended and participated in group. Discussion today was on self care. When ask What am I like when I am feeling well and What do I need to do less often to keep my overall wellbeing? Pt stated when she is feeling good she is more talkative,happy and enjoying life and what she could do to maintain her wellbeing is not to internalize things as much.  Shelly Bombard D 12/24/2012, 1:36 PM

## 2012-12-24 NOTE — Progress Notes (Signed)
[facility] MD Progress Note  12/24/2012 11:20 PM Margaret Gutierrez  MRN:  [id number] Subjective:  Met with Margaret Gutierrez today to discuss her response to treatment at [facility]. She tells me she is feeling well and is glad she came to [facility] but she feels that she is ready to go home. She denies SI/HI or AVH.  She shows some good insight into her relationship with her employer who's wife may be feeling some jealousy over Margaret Gutierrez's relationship with her. Margaret Gutierrez also feels that she made a good decision to be honest with her therapist due to her disclosure over having suicidal thoughts. She denies having those thoughts now, denies HI or AVH.  Diagnosis:   Schizophrenia Disorders:  Obsessive-Compulsive Disorders:  Trauma-Stressor Disorders:  Substance/Addictive Disorders:  Depressive Disorders:  AXIS I: Generalized Anxiety Disorder and Major Depression, single episode  AXIS II: Cluster B Traits  AXIS III: History reviewed. No pertinent past medical history.  AXIS IV: other psychosocial or environmental problems, problems related to social environment and problems with primary support group  AXIS V: 41-50 serious symptoms  ADL's:  Intact  Sleep: Good  Appetite:  Negative  Suicidal Ideation:  denies Homicidal Ideation:  denies AEB (as evidenced by):  Psychiatric Specialty Exam: Review of Systems  Constitutional: Negative.  Negative for fever, chills, weight loss, malaise/fatigue and diaphoresis.  HENT: Negative for congestion and sore throat.   Eyes: Negative for blurred vision, double vision and photophobia.  Respiratory: Negative for cough, shortness of breath and wheezing.   Cardiovascular: Negative for chest pain, palpitations and PND.  Gastrointestinal: Negative for heartburn, nausea, vomiting, abdominal pain, diarrhea and constipation.  Musculoskeletal: Negative for falls, joint pain and myalgias.  Neurological: Negative for dizziness, tingling, tremors, sensory change, speech change, focal weakness,  seizures, loss of consciousness, weakness and headaches.  Endo/Heme/Allergies: Negative for polydipsia. Does not bruise/bleed easily.  Psychiatric/Behavioral: Negative for depression, suicidal ideas, hallucinations, memory loss and substance abuse. The patient is not nervous/anxious and does not have insomnia.     Blood pressure 115/83, pulse 84, temperature 97.8 F (36.6 C), temperature source Oral, resp. rate 16, height 5\' 6"  (1.676 m), weight 98.884 kg (218 lb), last menstrual period 12/10/2012.Body mass index is 35.2 kg/(m^2).  General Appearance: Casual  Eye Contact::  Good  Speech:  Clear and Coherent  Volume:  Normal  Mood:  Euthymic  Affect:  Congruent  Thought Process:  Goal Directed  Orientation:  Full (Time, Place, and Person)  Thought Content:  WDL  Suicidal Thoughts:  No  Homicidal Thoughts:  No  Memory:  Immediate;   Good  Judgement:  Intact  Insight:  Present  Psychomotor Activity:  Normal  Concentration:  Good  Recall:  Good  Akathisia:  No  Handed:  Right  AIMS (if indicated):     Assets:  Communication Skills Desire for Improvement  Sleep:  Number of Hours: 6.25   Current Medications: Current Facility-Administered Medications  Medication Dose Route Frequency Provider Last Rate Last Dose  . acetaminophen (TYLENOL) tablet 650 mg  650 mg Oral Q6H PRN Larena Sox, MD      . alum & mag hydroxide-simeth (MAALOX/MYLANTA) 200-200-20 MG/5ML suspension 30 mL  30 mL Oral Q4H PRN Larena Sox, MD      . hydrOXYzine (ATARAX/VISTARIL) tablet 25 mg  25 mg Oral QHS PRN,MR X 1 Shaji J Puthuvel, MD      . magnesium hydroxide (MILK OF MAGNESIA) suspension 30 mL  30 mL Oral Daily PRN Iven Finn Puthuvel,  MD        Lab Results: No results found for this or any previous visit (from the past 48 hour(s)).  Physical Findings: AIMS: Facial and Oral Movements Muscles of Facial Expression: None, normal Lips and Perioral Area: None, normal Jaw: None, normal Tongue: None,  normal,Extremity Movements Upper (arms, wrists, hands, fingers): None, normal Lower (legs, knees, ankles, toes): None, normal, Trunk Movements Neck, shoulders, hips: None, normal, Overall Severity Severity of abnormal movements (highest score from questions above): None, normal Incapacitation due to abnormal movements: None, normal Patient's awareness of abnormal movements (rate only patient's report): No Awareness, Dental Status Current problems with teeth and/or dentures?: No Does patient usually wear dentures?: No  CIWA:  CIWA-Ar Total: 1 COWS:  COWS Total Score: 2  Treatment Plan Summary: Daily contact with patient to assess and evaluate symptoms and progress in treatment Medication management  Plan: 1. Continue crisis management and stabilization. 2. Medication management to reduce current symptoms to base line and improve patient's overall level of functioning 3. Treat health problems as indicated. 4. Develop treatment plan to decrease risk of relapse upon discharge and the need for     readmission. 5. Psycho-social education regarding relapse prevention and self care. 6. Health care follow up as needed for medical problems. 7. Continue home medications where appropriate. 8. Anticipate D/C home tomorrow.   Medical Decision Making Problem Points:  Established problem, stable/improving (1) Data Points:  Review and summation of old records (2)  I certify that inpatient services furnished can reasonably be expected to improve the patient's condition.  Margaret Gutierrez. Margaret Gutierrez RPAC 11:26 PM 12/24/2012  Reviewed the information documented and agree with the treatment plan.  Margaret Gutierrez,JANARDHAHA R. 12/25/2012 12:29 PM

## 2012-12-24 NOTE — Progress Notes (Signed)
[facility] Adult Case Management Discharge Plan :  Will you be returning to the same living situation after discharge: Yes,  home At discharge, do you have transportation home?:Yes,  friend Do you have the ability to pay for your medications:Yes,  insurance  Release of information consent forms completed and in the chart;  Patient's signature needed at discharge.  Patient to Follow up at: Follow-up Information   Follow up with [facility] [place] On 01/01/2013. (Tuesday at 11:00 with Rodman Comp)    Contact information:   [phone] [place] [phone]      Patient denies SI/HI:   Yes,  yes    Safety Planning and Suicide Prevention discussed:  Yes,  yes  Ida Rogue 12/24/2012, 3:41 PM

## 2012-12-24 NOTE — Progress Notes (Signed)
Adult Psychoeducational Group Note  Date:  12/24/2012 Time:  10:37 PM  Group Topic/Focus:  Wrap-Up Group:   The focus of this group is to help patients review their daily goal of treatment and discuss progress on daily workbooks.  Participation Level:  Active  Participation Quality:  Appropriate  Affect:  Appropriate  Cognitive:  Appropriate  Insight: Appropriate  Engagement in Group:  Engaged  Modes of Intervention:  Support  Additional Comments:  One good thing is that she gets to go home tomorrow and that she learned a lot of coping skills and that she plans to live her life better by dealing with her emotions rather than holding it all in.   Lorelie Biermann 12/24/2012, 10:37 PM

## 2012-12-24 NOTE — Progress Notes (Signed)
Pt attended spiritual care group on grief and loss facilitated by chaplain Burnis Kingfisher.  Group opened with brief discussion and psycho-social ed around grief and loss in relationships and in relation to self - identifying life patterns, circumstances, changes that cause losses. Established group norm of speaking from own life experience.  Group goal of establishing open and affirming space for members to share loss and experience with grief, normalize grief experience and provide psycho social education and grief support. Group members discussed guilt related to decisions made in life - expressing grief around the ways they have hurt others, changes in relationships, and difficulty in forgiving self and accepting forgiveness from others.  Also discussed loss of family members without saying goodbye, focusing on theme of regret around things left unsaid.  Group discussed ways different family members     Lamari was able to recognize and offer support to another group member who described feelings of guilt and grief around previous decisions.  Liane described her own feelings of regret and guilt to group.    Belva Crome MDiv

## 2012-12-24 NOTE — Progress Notes (Signed)
[facility] Group Notes:  (Nursing/MHT/Case Management/Adjunct)  Date:  12/23/2012  Time:  2000  Type of Therapy:  Psychoeducational Skills  Participation Level:  Active  Participation Quality:  Appropriate  Affect:  Appropriate  Cognitive:  Appropriate  Insight:  Good  Engagement in Group:  Engaged  Modes of Intervention:  Education  Summary of Progress/Problems: The patient stated that she had a good day since she was able to learn from the groups. She indicated that she learned a number of things with regards to coping better with her situation. Her goal for tomorrow is to get discharged. The patient's support system consists of her therapist.   Westly Pam 12/24/2012, 1:08 AM

## 2012-12-25 NOTE — Progress Notes (Signed)
Patient ID: Margaret Gutierrez, female   DOB: 1980-10-09, 32 y.o.   MRN: [id number] Discharge Note:   Patient discharged home.  Denied SI and HI.  Denied A/V hallucinations.  Denied pain.  Suicide prevention information given to patient and discussed with patient who stated she understood and had no questions.  Patient received all her belongings, miscellaneous items, toiletries.  Patient stated she appreciated all assistance received from [facility] staff.

## 2012-12-25 NOTE — Progress Notes (Signed)
Recreation Therapy Notes  Date: 10.13.2014 Time: 3:00pm Location: [address] Dayroom  Group Topic: Communication, Team Building, Problem Solving  Goal Area(s) Addresses:  Patient will effectively work with peer towards shared goal.  Patient will identify skill used to make activity successful.  Patient will identify how skills used during activity can be used to reach post d/c goals.   Behavioral Response: Engaged, Appropriate   Intervention: Problem Solving Activitiy  Activity: Life Boat. Patients were given a scenario about being on a sinking yacht. Patients were informed the yacht included 15 guest, 8 of which could be placed on the life boat, along with all group members. Individuals on guest list were of varying socioeconomic classes such as a Education officer, museum, Materials engineer, Midwife, Tree surgeon.   Education: Customer service manager, Discharge Planning   Education Outcome: Acknowledges understanding  Clinical Observations/Feedback: Patient actively engaged in activity, voicing her opinion and debating with group members appropriately. Patient made no contributions to wrap up discussion, but appeared to actively listen as she maintained appropriate eye contact with speaker.   Marykay Lex Kendan Cornforth, LRT/CTRS  Ibrohim Simmers L 12/25/2012 9:05 AM

## 2012-12-25 NOTE — Progress Notes (Signed)
Recreation Therapy Notes   Date: 10.14.2014 Time: 2:45pm Location: 500 [address] Dayroom  Group Topic: Animal Assisted Activities (AAA)  Behavioral Response: Engaged, Attentive  Affect: Euthymic  Clinical Observations/Feedback: Dog Team: Raliegh & handler. Patient interacted appropriately with peer, dog team, LRT and MHT.   Nyomie Ehrlich L Leonila Speranza, LRT/CTRS  Alasha Mcguinness L 12/25/2012 4:09 PM 

## 2012-12-25 NOTE — Discharge Summary (Signed)
Physician Discharge Summary Note  Patient:  Margaret Gutierrez is an 32 y.o., female MRN:  [id number] DOB:  07/11/1980 Patient phone:  [phone] (home)  Patient address:   [address] [place] [address],   Date of Admission:  12/20/2012 Date of Discharge: 12/25/2012   Reason for Admission:  Suicidal ideation  Discharge Diagnoses: ROS  DSM5: Schizophrenia Disorders:  Obsessive-Compulsive Disorders:  Trauma-Stressor Disorders:  Substance/Addictive Disorders:  Depressive Disorders:  AXIS I: Generalized Anxiety Disorder and Major Depression, single episode  AXIS II: Cluster B Traits  AXIS III: History reviewed. No pertinent past medical history.  AXIS IV: other psychosocial or environmental problems, problems related to social environment and problems with primary support group  AXIS V: 41-50 serious symptoms  Level of Care:  OP  Hospital Course:        Margaret Gutierrez is an 32 y.o. female admitted voluntarily and emergently for depression and suicidal ideation with the idea of carbon monoxide poisoning. Patient was initially contacted her therapist who has given recommendation psychiatric evaluation and hospitalization for safety. Patient therapist is Berniece Andreas. She had been doing therapy with her since last August as she went through her divorce, which was finalized in September of 2014.       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 She had developed a close working relationship with her employer who she reports treated her like one of the family. The employer offered Cosima emotional support during her recent divorce and even gave her a key to her home to use whenever she needed. Bexlee relates that the employer "was like a mother to me, and treated me like a daughter." During the course of the past several months [name] had been sending her employer little cards and notes thanking her for her support and stating her appreciation  for the kindness she had been given. Mandalyn states she felt that the employer was uncomfortable with this, but did not recall the employer stating that. So she stated that her therapist had encouraged her to express her feelings which was untrue.       When [name] confessed that she had been dishonest about this the employer reported that their relationship had been damaged by [name]'s dishonesty and asked for the key back to her home.  Annice had stated to her therapist that she had a thought of harming herself but denied any actual previous attempts or plans to do so.      Margaret Gutierrez was very clear that she lives for her 12 year old daughter [place]. She does have a very limited support system, in that she reports her own mother is emotionally distant. She does not have any friends out side of work.        While on the unit [name] did attend groups, participated in unit programming and met with a clinical provider each day. She reported that she was not interested in medication that this was a one time problem. [name] requested discharge home with plans to follow up as noted below. She denied SI/HI or AVH. She was able to contract for safety as noted.  Consults:  None  Significant Diagnostic Studies:  labs: CBC, CMP, UA, UDS  Discharge Vitals:   Blood pressure 121/83, pulse 81, temperature 97.7 F (36.5 C), temperature source Oral, resp. rate 20, height 5\' 6"  (1.676 m), weight 98.884 kg (218 lb), last menstrual period 12/10/2012. Body mass index is 35.2 kg/(m^2). Lab Results:  No results found for this or any previous visit (from the past 72 hour(s)).  Physical Findings: AIMS: Facial and Oral Movements Muscles of Facial Expression: None, normal Lips and Perioral Area: None, normal Jaw: None, normal Tongue: None, normal,Extremity Movements Upper (arms, wrists, hands, fingers): None, normal Lower (legs, knees, ankles, toes): None, normal, Trunk Movements Neck, shoulders, hips: None, normal,  Overall Severity Severity of abnormal movements (highest score from questions above): None, normal Incapacitation due to abnormal movements: None, normal Patient's awareness of abnormal movements (rate only patient's report): No Awareness, Dental Status Current problems with teeth and/or dentures?: No Does patient usually wear dentures?: No  CIWA:  CIWA-Ar Total: 1 COWS:  COWS Total Score: 2  Psychiatric Specialty Exam: See Psychiatric Specialty Exam and Suicide Risk Assessment completed by Attending Physician prior to discharge.  Discharge destination:  Home  Is patient on multiple antipsychotic therapies at discharge:  No   Has Patient had three or more failed trials of antipsychotic monotherapy by history:  No  Recommended Plan for Multiple Antipsychotic Therapies: NA  Discharge Orders   Future Orders Complete By Expires   Diet - low sodium heart healthy  As directed    Discharge instructions  As directed    Comments:     You have not been prescribed any medications. See your out patient therapist as planned. Remember to get plenty of rest and eat regular meals. Return to the nearest Emergency Room if you feel your symptoms returning.   Increase activity slowly  As directed        Medication List    Notice   You have not been prescribed any medications.         Follow-up Information   Follow up with [facility] [place] On 01/01/2013. (Tuesday at 11:00 with Rodman Comp)    Contact information:   [address] [place] [phone]      Follow-up recommendations:   Activities: Resume activity as tolerated. Diet: Heart healthy low sodium diet Tests: Follow up testing will be determined by your out patient provider. Comments:    Total Discharge Time:  Greater than 30 minutes.  Signed: MASHBURN,NEIL 12/25/2012, 11:42 AM  Patient is seen personally for psychiatric evaluation and suicide risk assessment, case discussed with the  physician assistant. Discharge treatment plan was developed and Reviewed the information documented and agree with the treatment plan.  [name],JANARDHAHA R. 01/02/2013 2:53 PM

## 2012-12-25 NOTE — Progress Notes (Signed)
D:  Patient's self inventory sheet, patient sleeps well, good appetite, normal energy level, good attention span.  Denied depression and hopelessness.  Denied withdrawals.  Denied SI.  Denied physical problems.  Denied pain.  "I plan to make a new start with my daughter.  I'm ready to go home.  Thank you for being so nice and helpful."  Does have discharge plans.  No problems taking meds after discharge. A:  Medications administered per MD orders.  Emotional support and encouragement given patient. R:  Denied SI and HI.  Denied A/V hallucinations.  Denied pain.  Will continue to monitor for safety with 15 minute checks.  Safety maintained.

## 2012-12-25 NOTE — [facility] Group Notes (Signed)
[facility] LCSW Group Therapy  12/25/2012  1:15 PM   Type of Therapy:  Group Therapy  Participation Level:  Active  Participation Quality:  Appropriate and Attentive  Affect:  Appropriate and Calm  Cognitive:  Alert and Appropriate  Insight:  Developing/Improving and Engaged  Engagement in Therapy:  Developing/Improving and Engaged  Modes of Intervention:  Clarification, Confrontation, Discussion, Education, Exploration, Limit-setting, Orientation, Problem-solving, Rapport Building, Dance movement psychotherapist, Socialization and Support  Summary of Progress/Problems: The topic for group therapy was feelings about diagnosis.  Pt actively participated in group discussion on their past and current diagnosis and how they feel towards this.  Pt also identified how society and family members judge them, based on their diagnosis as well as stereotypes and stigmas.  Pt came to group towards the end and shared that she doesn't have a diagnosis and is here for having a fall out with someone important in her life.  Pt actively listened to group discussion.    Margaret Gutierrez, LCSWA 12/25/2012 2:46 PM

## 2012-12-25 NOTE — Progress Notes (Signed)
The focus of this group is to educate the patient on the purpose and policies of crisis stabilization and provide a format to answer questions about their admission.  The group details unit policies and expectations of patients while admitted.  Patient attended 0900 nurse orientation education group this morning.  Patient actively participated, appropriate affect, alert, appropriate insight and engagement.  Today patient will work on 3 goals for discharge.

## 2012-12-25 NOTE — [facility] Suicide Risk Assessment (Signed)
[facility] INPATIENT:  Family/Significant Other Suicide Prevention Education  Suicide Prevention Education:  Contact Attempts: Margaret Gutierrez, ex-husband, [phone]  has been identified by the patient as the family member/significant other with whom the patient will be residing, and identified as the person(s) who will aid the patient in the event of a mental health crisis.  With written consent from the patient, two attempts were made to provide suicide prevention education, prior to and/or following the patient's discharge.  We were unsuccessful in providing suicide prevention education.  A suicide education pamphlet was given to the patient to share with family/significant other.  Date and time of first attempt:12/24/2012 @ 4:15PM Date and time of second attempt:12/25/2012 @ 8:45AM  Daryel Gerald B 12/25/2012, 1:34 PM

## 2012-12-25 NOTE — Progress Notes (Signed)
[facility] Adult Case Management Discharge Plan :  Will you be returning to the same living situation after discharge: Yes,  home At discharge, do you have transportation home?:Yes,  family Do you have the ability to pay for your medications:Yes,  insurance  Release of information consent forms completed and in the chart;  Patient's signature needed at discharge.  Patient to Follow up at: Follow-up Information   Follow up with [facility] [place] On 01/01/2013. (Tuesday at 11:00 with Rodman [facility])    Contact information:   [phone] [place] [phone]      Patient denies SI/HI:   Yes,  yes    Safety Planning and Suicide Prevention discussed:  Yes,  yes  Ida Rogue 12/25/2012, 1:36 PM

## 2012-12-25 NOTE — [facility] Suicide Risk Assessment (Signed)
Suicide Risk Assessment  Discharge Assessment     Demographic Factors:  Adolescent or young adult, Caucasian, Low socioeconomic status and Unemployed  Mental Status Per Nursing Assessment::   On Admission:  Self-harm thoughts  Current Mental Status by Physician: She has good mood and bright and appropriate affect. she has normal speech and thought process. she has deneid suicidal or homicidal ideations, intention or plans. she has no evidence of psychosis. she has fair insight, judgment and impulse control.   Loss Factors: Financial problems/change in socioeconomic status  Historical Factors: NA  Risk Reduction Factors:   Responsible for children under 74 years of age, Sense of responsibility to family, Religious beliefs about death, Living with another person, especially a relative, Positive social support, Positive therapeutic relationship and Positive coping skills or problem solving skills  Continued Clinical Symptoms:  Depression:   Recent sense of peace/wellbeing Unstable or Poor Therapeutic Relationship Previous Psychiatric Diagnoses and Treatments  Cognitive Features That Contribute To Risk:  Polarized thinking    Suicide Risk:  Minimal: No identifiable suicidal ideation.  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 No pertinent past medical history. AXIS IV:  other psychosocial or environmental problems, problems related to social environment and problems with primary support group AXIS V:  61-70 mild symptoms  Plan Of Care/Follow-up recommendations:  Activity:  as tolerated Diet:  regular  Is patient on multiple antipsychotic therapies at discharge:  No   Has Patient had three or more failed trials  of antipsychotic monotherapy by history:  No  Recommended Plan for Multiple Antipsychotic Therapies: NA  Abbegale Stehle,JANARDHAHA R., MD 12/25/2012, 11:05 AM

## 2012-12-25 NOTE — Progress Notes (Signed)
Adult Psychoeducational Group Note  Date:  12/25/2012 Time:  11:00am Group Topic/Focus:  Recovery Goals:   The focus of this group is to identify appropriate goals for recovery and establish a plan to achieve them.  Participation Level:  Active  Participation Quality:  Appropriate and Attentive  Affect:  Appropriate  Cognitive:  Alert and Appropriate  Insight: Appropriate  Engagement in Group:  Engaged  Modes of Intervention:  Discussion and Education  Additional Comments:  Pt attended and participated in group. Discussion today was on recovery and what is their definition of recovery and what was their breaking point. Pt stated recovery means emotionally healthy and making better choices. Her breaking point was messing up so bad she felt like she could not make it right.   Shelly Bombard D 12/25/2012, 1:52 PM

## 2012-12-28 NOTE — Progress Notes (Signed)
Patient Discharge Instructions:  Next Level Care Provider Has Access to the EMR, 12/28/12 Records provided to [facility] via [facility]/Epic access.  Jerelene Redden, 12/28/2012, 1:52 PM

## 2013-01-01 ENCOUNTER — Ambulatory Visit: Payer: Self-pay | Admitting: Licensed Clinical Social Worker

## 2019-06-27 ENCOUNTER — Ambulatory Visit: Payer: Self-pay | Attending: Internal Medicine

## 2019-06-27 DIAGNOSIS — Z23 Encounter for immunization: Secondary | ICD-10-CM

## 2019-06-27 NOTE — Progress Notes (Signed)
   Covid-19 Vaccination Clinic  Name:  Margaret Gutierrez    MRN: [id number] DOB: Mar 13, 1981  06/27/2019  Margaret Gutierrez was observed post Covid-19 immunization for 15 minutes without incident. She was provided with Vaccine Information Sheet and instruction to access the V-Safe system.   Margaret Gutierrez was instructed to call 911 with any severe reactions post vaccine: Marland Kitchen Difficulty breathing  . Swelling of face and throat  . A fast heartbeat  . A bad rash all over body  . Dizziness and weakness   Immunizations Administered    Name Date Dose VIS Date Route   Pfizer COVID-19 Vaccine 06/27/2019 11:02 AM 0.3 mL 02/22/2019 Intramuscular   Manufacturer: [employer], [employer]   Lot: [id number]   NDC: [address]-2

## 2019-07-22 ENCOUNTER — Ambulatory Visit: Payer: Self-pay | Attending: Internal Medicine

## 2019-07-22 DIAGNOSIS — Z23 Encounter for immunization: Secondary | ICD-10-CM

## 2019-07-22 NOTE — Progress Notes (Signed)
   Covid-19 Vaccination Clinic  Name:  Margaret Gutierrez    MRN: [id number] DOB: Aug 11, 1980  07/22/2019  [name] was observed post Covid-19 immunization for 15 minutes without incident. She was provided with Vaccine Information Sheet and instruction to access the V-Safe system.   [name] was instructed to call 911 with any severe reactions post vaccine: Marland Kitchen Difficulty breathing  . Swelling of face and throat  . A fast heartbeat  . A bad rash all over body  . Dizziness and weakness   Immunizations Administered    Name Date Dose VIS Date Route   Pfizer COVID-19 Vaccine 07/22/2019  9:27 AM 0.3 mL 05/08/2018 Intramuscular   Manufacturer: [employer], [employer]   Lot: [id number]   NDC: [address]-5

## 2020-09-04 ENCOUNTER — Other Ambulatory Visit: Payer: Self-pay | Admitting: Obstetrics and Gynecology

## 2020-09-17 ENCOUNTER — Encounter (HOSPITAL_BASED_OUTPATIENT_CLINIC_OR_DEPARTMENT_OTHER): Payer: Self-pay | Admitting: Obstetrics and Gynecology

## 2020-09-17 ENCOUNTER — Other Ambulatory Visit: Payer: Self-pay

## 2020-09-17 NOTE — Progress Notes (Addendum)
Spoke w/ via phone for pre-op interview---pt Lab needs dos----   serum preg            Lab results------has lab appt 09-18-2020 1100 am for [facility] COVID test -----patient states asymptomatic no test needed Arrive at -------930 am 09-22-2020 NPO after MN NO Solid Food.  Clear liquids from MN until---830 am then npo Med rec completed Medications to take morning of surgery -----birth control pill Diabetic medication -----n/a Patient instructed no nail polish to be worn day of surgery Patient instructed to bring photo id and insurance card day of surgery Patient aware to have Driver (ride ) / caregiver  spouse todd Janota will stay  for 24 hours after surgery  Patient Special Instructions -----none Pre-Op special Istructions -----none Patient verbalized understanding of instructions that were given at this phone interview. Patient denies shortness of breath, chest pain, fever, cough at this phone interview.   Addendum: spoke with dr Roxan Hockey mda and ewviewed pt blood pressure at pre op 154/106 right arm and 145/107 left arm and ekg done 09-18-2020 results and pt medical history , pt ok for surgery 09-22-2020 per dr Willette Alma pa, called shenelle at dr Billy Coast office and made aware elevated bp and ekg results and pt ok for 09-22-2020 surgery per dr Romie Jumper wood mda, shenelle to make dr Billy Coast aware

## 2020-09-18 ENCOUNTER — Encounter (HOSPITAL_COMMUNITY): Payer: Self-pay

## 2020-09-18 ENCOUNTER — Encounter (HOSPITAL_COMMUNITY)
Admission: RE | Admit: 2020-09-18 | Discharge: 2020-09-18 | Disposition: A | Discharge status: Home / Self Care | Payer: 59 | Source: Ambulatory Visit | Attending: Obstetrics and Gynecology | Admitting: Obstetrics and Gynecology

## 2020-09-18 ENCOUNTER — Other Ambulatory Visit: Payer: Self-pay

## 2020-09-18 DIAGNOSIS — Z01818 Encounter for other preprocedural examination: Secondary | ICD-10-CM | POA: Insufficient documentation

## 2020-09-18 LAB — CBC
HCT: 38.6 % (ref 36.0–46.0)
Hemoglobin: 13.1 g/dL (ref 12.0–15.0)
MCH: 27.5 pg (ref 26.0–34.0)
MCHC: 33.9 g/dL (ref 30.0–36.0)
MCV: 80.9 fL (ref 80.0–100.0)
Platelets: 396 10*3/uL (ref 150–400)
RBC: 4.77 MIL/uL (ref 3.87–5.11)
RDW: 12.6 % (ref 11.5–15.5)
WBC: 8.8 10*3/uL (ref 4.0–10.5)
nRBC: 0 % (ref 0.0–0.2)

## 2020-09-22 ENCOUNTER — Encounter (HOSPITAL_BASED_OUTPATIENT_CLINIC_OR_DEPARTMENT_OTHER)
Admission: RE | Disposition: A | Discharge status: Home / Self Care | Payer: Self-pay | Source: Ambulatory Visit | Attending: Obstetrics and Gynecology

## 2020-09-22 ENCOUNTER — Ambulatory Visit (HOSPITAL_BASED_OUTPATIENT_CLINIC_OR_DEPARTMENT_OTHER)
Admission: RE | Admit: 2020-09-22 | Discharge: 2020-09-22 | Disposition: A | Discharge status: Home / Self Care | Payer: 59 | Source: Ambulatory Visit | Attending: Obstetrics and Gynecology | Admitting: Obstetrics and Gynecology

## 2020-09-22 ENCOUNTER — Ambulatory Visit (HOSPITAL_BASED_OUTPATIENT_CLINIC_OR_DEPARTMENT_OTHER): Payer: 59 | Admitting: Emergency Medicine

## 2020-09-22 ENCOUNTER — Ambulatory Visit (HOSPITAL_BASED_OUTPATIENT_CLINIC_OR_DEPARTMENT_OTHER): Payer: 59 | Admitting: Anesthesiology

## 2020-09-22 ENCOUNTER — Encounter (HOSPITAL_BASED_OUTPATIENT_CLINIC_OR_DEPARTMENT_OTHER): Payer: Self-pay | Admitting: Obstetrics and Gynecology

## 2020-09-22 DIAGNOSIS — I10 Essential (primary) hypertension: Secondary | ICD-10-CM | POA: Insufficient documentation

## 2020-09-22 DIAGNOSIS — Z302 Encounter for sterilization: Secondary | ICD-10-CM | POA: Diagnosis not present

## 2020-09-22 HISTORY — DX: Other specified health status: Z78.9

## 2020-09-22 HISTORY — PX: LAPAROSCOPIC TUBAL LIGATION: SHX1937

## 2020-09-22 LAB — HCG, SERUM, QUALITATIVE: Preg, Serum: NEGATIVE

## 2020-09-22 SURGERY — LIGATION, FALLOPIAN TUBE, LAPAROSCOPIC
Anesthesia: General | Site: Abdomen | Laterality: Bilateral

## 2020-09-22 MED ORDER — LIDOCAINE HCL (PF) 2 % IJ SOLN
INTRAMUSCULAR | Status: AC
  Filled 2020-09-22: qty 5

## 2020-09-22 MED ORDER — DEXAMETHASONE SODIUM PHOSPHATE 10 MG/ML IJ SOLN
INTRAMUSCULAR | Status: DC | PRN
  Administered 2020-09-22: 5 mg via INTRAVENOUS

## 2020-09-22 MED ORDER — PROPOFOL 10 MG/ML IV BOLUS
INTRAVENOUS | Status: DC | PRN
  Administered 2020-09-22: 170 mg via INTRAVENOUS
  Administered 2020-09-22: 30 mg via INTRAVENOUS

## 2020-09-22 MED ORDER — ROCURONIUM BROMIDE 10 MG/ML (PF) SYRINGE
PREFILLED_SYRINGE | INTRAVENOUS | Status: DC | PRN
  Administered 2020-09-22: 50 mg via INTRAVENOUS

## 2020-09-22 MED ORDER — ROCURONIUM BROMIDE 10 MG/ML (PF) SYRINGE
PREFILLED_SYRINGE | INTRAVENOUS | Status: AC
  Filled 2020-09-22: qty 10

## 2020-09-22 MED ORDER — MIDAZOLAM HCL 2 MG/2ML IJ SOLN
INTRAMUSCULAR | Status: AC
  Filled 2020-09-22: qty 2

## 2020-09-22 MED ORDER — FENTANYL CITRATE (PF) 250 MCG/5ML IJ SOLN
INTRAMUSCULAR | Status: DC | PRN
  Administered 2020-09-22: 50 ug via INTRAVENOUS
  Administered 2020-09-22 (×2): 100 ug via INTRAVENOUS

## 2020-09-22 MED ORDER — OXYCODONE HCL 5 MG/5ML PO SOLN: 5.0000 mg | Freq: Once | ORAL | Status: DC | PRN

## 2020-09-22 MED ORDER — SODIUM CHLORIDE 0.9 % IV SOLN
2.0000 g | INTRAVENOUS | Status: AC
  Administered 2020-09-22: 2 g via INTRAVENOUS

## 2020-09-22 MED ORDER — PROPOFOL 10 MG/ML IV BOLUS
INTRAVENOUS | Status: AC
  Filled 2020-09-22: qty 20

## 2020-09-22 MED ORDER — MIDAZOLAM HCL 5 MG/5ML IJ SOLN
INTRAMUSCULAR | Status: DC | PRN
  Administered 2020-09-22: 2 mg via INTRAVENOUS

## 2020-09-22 MED ORDER — LIDOCAINE 2% (20 MG/ML) 5 ML SYRINGE
INTRAMUSCULAR | Status: DC | PRN
  Administered 2020-09-22: 60 mg via INTRAVENOUS

## 2020-09-22 MED ORDER — FENTANYL CITRATE (PF) 250 MCG/5ML IJ SOLN
INTRAMUSCULAR | Status: AC
  Filled 2020-09-22: qty 5

## 2020-09-22 MED ORDER — SODIUM CHLORIDE 0.9 % IV SOLN
INTRAVENOUS | Status: AC
  Filled 2020-09-22: qty 2

## 2020-09-22 MED ORDER — 0.9 % SODIUM CHLORIDE (POUR BTL) OPTIME
TOPICAL | Status: DC | PRN
  Administered 2020-09-22: 500 mL

## 2020-09-22 MED ORDER — BUPIVACAINE HCL (PF) 0.25 % IJ SOLN
INTRAMUSCULAR | Status: DC | PRN
  Administered 2020-09-22: 10 mL

## 2020-09-22 MED ORDER — OXYCODONE HCL 5 MG PO TABS: 5.0000 mg | ORAL_TABLET | Freq: Once | ORAL | Status: DC | PRN

## 2020-09-22 MED ORDER — OXYCODONE-ACETAMINOPHEN 5-325 MG PO TABS: 1.0000 | ORAL_TABLET | ORAL | 0 refills | Status: AC | PRN

## 2020-09-22 MED ORDER — SUGAMMADEX SODIUM 500 MG/5ML IV SOLN
INTRAVENOUS | Status: DC | PRN
  Administered 2020-09-22: 300 mg via INTRAVENOUS

## 2020-09-22 MED ORDER — ACETAMINOPHEN 10 MG/ML IV SOLN: 1000.0000 mg | Freq: Once | INTRAVENOUS | Status: DC | PRN

## 2020-09-22 MED ORDER — DEXAMETHASONE SODIUM PHOSPHATE 10 MG/ML IJ SOLN
INTRAMUSCULAR | Status: AC
  Filled 2020-09-22: qty 1

## 2020-09-22 MED ORDER — POVIDONE-IODINE 10 % EX SWAB: 2.0000 "application " | Freq: Once | CUTANEOUS | Status: DC

## 2020-09-22 MED ORDER — FENTANYL CITRATE (PF) 100 MCG/2ML IJ SOLN: 25.0000 ug | INTRAMUSCULAR | Status: DC | PRN

## 2020-09-22 MED ORDER — PROMETHAZINE HCL 25 MG/ML IJ SOLN: 6.2500 mg | INTRAMUSCULAR | Status: DC | PRN

## 2020-09-22 MED ORDER — SUGAMMADEX SODIUM 500 MG/5ML IV SOLN
INTRAVENOUS | Status: AC
  Filled 2020-09-22: qty 5

## 2020-09-22 MED ORDER — ONDANSETRON HCL 4 MG/2ML IJ SOLN
INTRAMUSCULAR | Status: AC
  Filled 2020-09-22: qty 2

## 2020-09-22 MED ORDER — ONDANSETRON HCL 4 MG/2ML IJ SOLN
INTRAMUSCULAR | Status: DC | PRN
  Administered 2020-09-22: 4 mg via INTRAVENOUS

## 2020-09-22 MED ORDER — LACTATED RINGERS IV SOLN: INTRAVENOUS | Status: DC

## 2020-09-22 SURGICAL SUPPLY — 25 items
ADH SKN CLS APL DERMABOND .7 (GAUZE/BANDAGES/DRESSINGS) ×1
CATH ROBINSON RED A/P 16FR (CATHETERS) ×2 IMPLANT
DERMABOND ADVANCED (GAUZE/BANDAGES/DRESSINGS) ×1
DERMABOND ADVANCED .7 DNX12 (GAUZE/BANDAGES/DRESSINGS) ×1 IMPLANT
DRSG OPSITE POSTOP 3X4 (GAUZE/BANDAGES/DRESSINGS) IMPLANT
DURAPREP 26ML APPLICATOR (WOUND CARE) ×2 IMPLANT
GLOVE SURG ENC MOIS LTX SZ7.5 (GLOVE) ×4 IMPLANT
GLOVE SURG UNDER POLY LF SZ7 (GLOVE) ×4 IMPLANT
GOWN STRL REUS W/TWL LRG LVL3 (GOWN DISPOSABLE) ×4 IMPLANT
HIBICLENS CHG 4% 4OZ (MISCELLANEOUS) IMPLANT
KIT TURNOVER CYSTO (KITS) ×2 IMPLANT
NEEDLE INSUFFLATION 120MM (ENDOMECHANICALS) ×2 IMPLANT
NS IRRIG 500ML POUR BTL (IV SOLUTION) ×2 IMPLANT
PACK LAPAROSCOPY BASIN (CUSTOM PROCEDURE TRAY) ×2 IMPLANT
PACK TRENDGUARD 450 HYBRID PRO (MISCELLANEOUS) ×1 IMPLANT
PROTECTOR NERVE ULNAR (MISCELLANEOUS) IMPLANT
SET TUBE SMOKE EVAC HIGH FLOW (TUBING) ×2 IMPLANT
SOLUTION ELECTROLUBE (MISCELLANEOUS) ×2 IMPLANT
SUT VIC AB 4-0 PS2 18 (SUTURE) IMPLANT
SUT VICRYL 0 UR6 27IN ABS (SUTURE) ×2 IMPLANT
TOWEL OR 17X26 10 PK STRL BLUE (TOWEL DISPOSABLE) ×2 IMPLANT
TRENDGUARD 450 HYBRID PRO PACK (MISCELLANEOUS) ×2
TROCAR OPTI TIP 5M 100M (ENDOMECHANICALS) IMPLANT
TROCAR XCEL DIL TIP R 11M (ENDOMECHANICALS) ×2 IMPLANT
WARMER LAPAROSCOPE (MISCELLANEOUS) ×2 IMPLANT

## 2020-09-22 NOTE — Transfer of Care (Signed)
Immediate Anesthesia Transfer of Care Note  Patient: Margaret Gutierrez  Procedure(s) Performed: LAPAROSCOPIC TUBAL LIGATION (Bilateral: Abdomen)  Patient Location: PACU  Anesthesia Type:General  Level of Consciousness: awake and alert   Airway & Oxygen Therapy: Patient Spontanous Breathing and Patient connected to nasal cannula oxygen  Post-op Assessment: Report given to RN and Post -op Vital signs reviewed and stable  Post vital signs: Reviewed and stable  Last Vitals:  Vitals Value Taken Time  BP    Temp    Pulse 87 09/22/20 1220  Resp 11 09/22/20 1220  SpO2 97 % 09/22/20 1220  Vitals shown include unvalidated device data.  Last Pain:  Vitals:   09/22/20 0951  PainSc: 0-No pain      Patients Stated Pain Goal: 5 (09/22/20 0951)  Complications: No notable events documented.

## 2020-09-22 NOTE — Discharge Instructions (Signed)

## 2020-09-22 NOTE — H&P (Signed)
Margaret Gutierrez is an 40 y.o. female. Desire for elective sterilization  Pertinent Gynecological History: Menses: flow is moderate Bleeding: na Contraception: OCP (estrogen/progesterone) DES exposure: denies Blood transfusions: none Sexually transmitted diseases: no past history Previous GYN Procedures: DNC  Last mammogram:  na  Date: na Last pap: normal Date: 2022 OB History: G1, P1   Menstrual History: Menarche age: 81 Patient's last menstrual period was 09/05/2020 (exact date).    History reviewed. No pertinent past medical history.  Past Surgical History:  Procedure Laterality Date   DILATION AND CURETTAGE OF UTERUS     age 65    History reviewed. No pertinent family history.  Social History:  reports that she has never smoked. She has never used smokeless tobacco. She reports that she does not drink alcohol and does not use drugs.  Allergies: No Known Allergies  No medications prior to admission.    Review of Systems  Constitutional: Negative.   All other systems reviewed and are negative.  Height 5\' 7"  (1.702 m), weight 111.1 kg, last menstrual period 09/05/2020. Physical Exam Constitutional:      Appearance: Normal appearance.  HENT:     Head: Normocephalic and atraumatic.  Cardiovascular:     Rate and Rhythm: Normal rate and regular rhythm.     Pulses: Normal pulses.     Heart sounds: Normal heart sounds.  Pulmonary:     Effort: Pulmonary effort is normal.     Breath sounds: Normal breath sounds.  Abdominal:     General: Bowel sounds are normal.     Palpations: Abdomen is soft.  Genitourinary:    General: Normal vulva.  Musculoskeletal:        General: Normal range of motion.     Cervical back: Normal range of motion and neck supple.  Skin:    General: Skin is warm and dry.  Neurological:     General: No focal deficit present.     Mental Status: She is alert and oriented to person, place, and time.  Psychiatric:        Mood and Affect: Mood  normal.        Behavior: Behavior normal.    No results found for this or any previous visit (from the past 24 hour(s)).  No results found.  Assessment/Plan: Desire for elective sterilization LTL Surgical risks discussed . Consent done.  Mckenna Boruff J 09/22/2020, 6:41 AM

## 2020-09-22 NOTE — Op Note (Signed)
Margaret, Gutierrez MEDICAL RECORD NO: [id number] ACCOUNT NO: [id number] DATE OF BIRTH: 1980/12/08 FACILITY: [facility] LOCATION: [facility] PHYSICIAN: Lenoard Aden, MD  Operative Report   DATE OF PROCEDURE: 09/22/2020  PREOPERATIVE DIAGNOSIS:  Desire for elective sterilization.  POSTOPERATIVE DIAGNOSIS:  Desire for elective sterilization.  PROCEDURE:  Laparoscopic tubal ligation with bipolar cautery.  SURGEON:  Lenoard Aden, MD  ASSISTANT:  None.  ANESTHESIA:  Local, general.  ESTIMATED BLOOD LOSS:  Less than 5 mL.  COMPLICATIONS:  None.  DRAINS:  None.  COUNTS:  Correct.  DISPOSITION:  The patient was taken to recovery in good condition.  BRIEF OPERATIVE NOTE:  After being apprised of the risks of anesthesia, infection, bleeding, injury to surrounding organs, possible need for repair delayed versus immediate, complications to include bowel and bladder injury, possible need for repair,  failure risk of tubal ligation 5-10 per thousand, the patient was brought to the operating room where she was administered general anesthetic without complications.  She was prepped and draped in the usual sterile fashion and placed in the dorsal  lithotomy position.  Feet were placed in the Yellofin stirrups.  Exam under anesthesia revealed a mid positioned uterus and no adnexal masses.  Hulka tenaculum placed vaginally without difficulty.  Infraumbilical incision was then made with a scalpel.   Veress needle was placed.  Opening pressure of 1 noted.  3.7 liters of CO2 insufflated without difficulty.  Trocar was placed atraumatically.  Pictures were taken.  Visualization revealed normal liver, gallbladder bed.  Appendix not seen.  Small  subserosal less than 1 cm fibroid on the fundus.  Normal tubes, normal ovaries.  Kleppinger bipolar cautery entered through the operative port of the scope. Ampullary isthmic portion of the tube was traced out and cauterized to a resistance of 0 in 3   contiguous areas.  Both tubes were then divided using hook scissors.  Tubal lumens were visualized.  CO2 was released revealing good hemostasis and both separated tubes.  At this time, CO2 was released.  Positive pressure applied.  Trocar was removed  under direct visualization.  Incision was closed with 0 Vicryl, 4-0 Vicryl.  Marcaine was placed.  Dermabond was placed.  Hulka tenaculum was removed vaginally.  The patient was awakened and transferred to recovery in good condition.   ROH D: 09/22/2020 12:18:43 pm T: 09/22/2020 3:50:00 pm  JOB: [id number]/ [id number]

## 2020-09-22 NOTE — Anesthesia Preprocedure Evaluation (Signed)
Anesthesia Evaluation  Patient identified by MRN, date of birth, ID band Patient awake    Reviewed: Allergy & Precautions, NPO status , Patient's Chart, lab work & pertinent test results  Airway Mallampati: II  TM Distance: >3 FB Neck ROM: Full    Dental  (+) Teeth Intact   Pulmonary neg pulmonary ROS,    Pulmonary exam normal        Cardiovascular hypertension,  Rhythm:Regular Rate:Normal     Neuro/Psych Depression negative neurological ROS     GI/Hepatic negative GI ROS, Neg liver ROS,   Endo/Other  negative endocrine ROS  Renal/GU negative Renal ROS  Female GU complaint Elective sterilization    Musculoskeletal negative musculoskeletal ROS (+)   Abdominal (+)  Abdomen: soft. Bowel sounds: normal.  Peds  Hematology negative hematology ROS (+)   Anesthesia Other Findings BP elevated at pre-op clearance and DOS. Patient aware of need to establish care with PCP for management.   Reproductive/Obstetrics                             Anesthesia Physical Anesthesia Plan  ASA: 2  Anesthesia Plan: General   Post-op Pain Management:    Induction: Intravenous  PONV Risk Score and Plan: 3 and Ondansetron, Dexamethasone, Midazolam and Treatment may vary due to age or medical condition  Airway Management Planned: Mask and Oral ETT  Additional Equipment: None  Intra-op Plan:   Post-operative Plan: Extubation in OR  Informed Consent: I have reviewed the patients History and Physical, chart, labs and discussed the procedure including the risks, benefits and alternatives for the proposed anesthesia with the patient or authorized representative who has indicated his/her understanding and acceptance.     Dental advisory given  Plan Discussed with: CRNA  Anesthesia Plan Comments: (Lab Results      Component                Value               Date                      WBC                       8.8                 09/18/2020                HGB                      13.1                09/18/2020                HCT                      38.6                09/18/2020                MCV                      80.9                09/18/2020                PLT  396                 09/18/2020          )        Anesthesia Quick Evaluation

## 2020-09-22 NOTE — Op Note (Signed)
09/22/2020  12:14 PM  PATIENT:  Margaret Gutierrez  40 y.o. female  PRE-OPERATIVE DIAGNOSIS:  Desires Sterilization  POST-OPERATIVE DIAGNOSIS:  Desires Sterilization  PROCEDURE:  Procedure(s): LAPAROSCOPIC TUBAL LIGATION with bipolar cautery  SURGEON:  Surgeon(s): Olivia Mackie, MD  ASSISTANTS: none   ANESTHESIA:   local and general  ESTIMATED BLOOD LOSS: 5 mL   DRAINS: none   LOCAL MEDICATIONS USED:  MARCAINE    and Amount: 10 ml  SPECIMEN:  No Specimen  DISPOSITION OF SPECIMEN:  N/A  COUNTS:  YES  DICTATION #: [id number]  PLAN OF CARE: dc hiome  PATIENT DISPOSITION:  PACU - hemodynamically stable.

## 2020-09-22 NOTE — Anesthesia Procedure Notes (Signed)
Procedure Name: Intubation Date/Time: 09/22/2020 11:30 AM Performed by: Elyn Peers, CRNA Pre-anesthesia Checklist: Patient identified, Emergency Drugs available, Suction available, Patient being monitored and Timeout performed Patient Re-evaluated:Patient Re-evaluated prior to induction Oxygen Delivery Method: Circle system utilized Preoxygenation: Pre-oxygenation with 100% oxygen Induction Type: IV induction Ventilation: Mask ventilation without difficulty Laryngoscope Size: Miller and 3 Grade View: Grade I Tube type: Oral Tube size: 7.0 mm Number of attempts: 1 Airway Equipment and Method: Stylet Placement Confirmation: ETT inserted through vocal cords under direct vision, positive ETCO2 and breath sounds checked- equal and bilateral Secured at: 22 cm Tube secured with: Tape Dental Injury: Teeth and Oropharynx as per pre-operative assessment  Comments: Laryngeal pressure externally applied to optimize view.

## 2020-09-22 NOTE — Anesthesia Postprocedure Evaluation (Signed)
Anesthesia Post Note  Patient: Margaret Gutierrez  Procedure(s) Performed: LAPAROSCOPIC TUBAL LIGATION (Bilateral: Abdomen)     Patient location during evaluation: PACU Anesthesia Type: General Level of consciousness: awake and alert Pain management: pain level controlled Vital Signs Assessment: post-procedure vital signs reviewed and stable Respiratory status: spontaneous breathing, nonlabored ventilation, respiratory function stable and patient connected to nasal cannula oxygen Cardiovascular status: blood pressure returned to baseline and stable Postop Assessment: no apparent nausea or vomiting Anesthetic complications: no   No notable events documented.  Last Vitals:  Vitals:   09/22/20 1245 09/22/20 1300  BP: 135/83 (!) 140/98  Pulse: 71 75  Resp: 12 14  Temp:    SpO2: 98% 99%    Last Pain:  Vitals:   09/22/20 1259  PainSc: Asleep                 Earl Lites P Alexiz Sustaita

## 2020-09-23 ENCOUNTER — Encounter (HOSPITAL_BASED_OUTPATIENT_CLINIC_OR_DEPARTMENT_OTHER): Payer: Self-pay | Admitting: Obstetrics and Gynecology
